# Patient Record
Sex: Female | Born: 1960 | Race: White | Hispanic: No | Marital: Married | Smoking: Former smoker
Health system: Southern US, Community
[De-identification: ages and names within clinical notes are randomized; demographics above are authoritative.]

## PROBLEM LIST (undated history)

## (undated) DIAGNOSIS — Z8619 Personal history of other infectious and parasitic diseases: Secondary | ICD-10-CM

## (undated) DIAGNOSIS — F329 Major depressive disorder, single episode, unspecified: Secondary | ICD-10-CM

## (undated) DIAGNOSIS — F32A Depression, unspecified: Secondary | ICD-10-CM

## (undated) DIAGNOSIS — F419 Anxiety disorder, unspecified: Secondary | ICD-10-CM

## (undated) DIAGNOSIS — J449 Chronic obstructive pulmonary disease, unspecified: Secondary | ICD-10-CM

## (undated) DIAGNOSIS — M797 Fibromyalgia: Secondary | ICD-10-CM

## (undated) DIAGNOSIS — B1920 Unspecified viral hepatitis C without hepatic coma: Secondary | ICD-10-CM

## (undated) DIAGNOSIS — E785 Hyperlipidemia, unspecified: Secondary | ICD-10-CM

## (undated) DIAGNOSIS — Z8719 Personal history of other diseases of the digestive system: Secondary | ICD-10-CM

## (undated) DIAGNOSIS — I10 Essential (primary) hypertension: Secondary | ICD-10-CM

## (undated) DIAGNOSIS — J439 Emphysema, unspecified: Secondary | ICD-10-CM

## (undated) DIAGNOSIS — M810 Age-related osteoporosis without current pathological fracture: Secondary | ICD-10-CM

## (undated) DIAGNOSIS — J45909 Unspecified asthma, uncomplicated: Secondary | ICD-10-CM

## (undated) DIAGNOSIS — Z8781 Personal history of (healed) traumatic fracture: Secondary | ICD-10-CM

## (undated) HISTORY — DX: Hyperlipidemia, unspecified: E78.5

## (undated) HISTORY — DX: Personal history of (healed) traumatic fracture: Z87.81

## (undated) HISTORY — DX: Unspecified viral hepatitis C without hepatic coma: B19.20

## (undated) HISTORY — PX: MANDIBLE SURGERY: SHX707

## (undated) HISTORY — DX: Chronic obstructive pulmonary disease, unspecified: J44.9

## (undated) HISTORY — DX: Emphysema, unspecified: J43.9

## (undated) HISTORY — DX: Depression, unspecified: F32.A

## (undated) HISTORY — DX: Anxiety disorder, unspecified: F41.9

## (undated) HISTORY — PX: ABDOMINAL HYSTERECTOMY: SHX81

## (undated) HISTORY — DX: Personal history of other diseases of the digestive system: Z87.19

## (undated) HISTORY — DX: Fibromyalgia: M79.7

## (undated) HISTORY — PX: BILATERAL SALPINGOOPHORECTOMY: SHX1223

## (undated) HISTORY — DX: Major depressive disorder, single episode, unspecified: F32.9

## (undated) HISTORY — DX: Age-related osteoporosis without current pathological fracture: M81.0

## (undated) HISTORY — DX: Personal history of other infectious and parasitic diseases: Z86.19

---

## 2001-07-11 ENCOUNTER — Inpatient Hospital Stay (HOSPITAL_COMMUNITY): Admission: EM | Admit: 2001-07-11 | Discharge: 2001-07-13 | Payer: Self-pay | Admitting: Psychiatry

## 2001-10-05 ENCOUNTER — Emergency Department (HOSPITAL_COMMUNITY): Admission: EM | Admit: 2001-10-05 | Discharge: 2001-10-05 | Payer: Self-pay | Admitting: *Deleted

## 2002-06-11 ENCOUNTER — Ambulatory Visit (HOSPITAL_COMMUNITY): Admission: RE | Admit: 2002-06-11 | Discharge: 2002-06-11 | Payer: Self-pay | Admitting: General Surgery

## 2002-06-11 ENCOUNTER — Encounter: Payer: Self-pay | Admitting: General Surgery

## 2002-11-20 ENCOUNTER — Encounter: Payer: Self-pay | Admitting: Emergency Medicine

## 2002-11-20 ENCOUNTER — Inpatient Hospital Stay (HOSPITAL_COMMUNITY): Admission: EM | Admit: 2002-11-20 | Discharge: 2002-11-24 | Payer: Self-pay | Admitting: Emergency Medicine

## 2002-11-22 ENCOUNTER — Encounter: Payer: Self-pay | Admitting: Family Medicine

## 2003-01-16 ENCOUNTER — Emergency Department (HOSPITAL_COMMUNITY): Admission: EM | Admit: 2003-01-16 | Discharge: 2003-01-16 | Payer: Self-pay | Admitting: Emergency Medicine

## 2003-02-02 ENCOUNTER — Inpatient Hospital Stay (HOSPITAL_COMMUNITY): Admission: AD | Admit: 2003-02-02 | Discharge: 2003-02-04 | Payer: Self-pay | Admitting: General Surgery

## 2006-09-05 ENCOUNTER — Emergency Department (HOSPITAL_COMMUNITY): Admission: EM | Admit: 2006-09-05 | Discharge: 2006-09-05 | Payer: Self-pay | Admitting: Emergency Medicine

## 2006-10-23 ENCOUNTER — Ambulatory Visit: Payer: Self-pay | Admitting: Internal Medicine

## 2006-11-03 ENCOUNTER — Observation Stay (HOSPITAL_COMMUNITY): Admission: EM | Admit: 2006-11-03 | Discharge: 2006-11-03 | Payer: Self-pay | Admitting: Emergency Medicine

## 2006-11-03 ENCOUNTER — Ambulatory Visit: Payer: Self-pay | Admitting: Cardiology

## 2006-12-20 ENCOUNTER — Emergency Department (HOSPITAL_COMMUNITY): Admission: EM | Admit: 2006-12-20 | Discharge: 2006-12-20 | Payer: Self-pay | Admitting: Emergency Medicine

## 2007-04-11 ENCOUNTER — Emergency Department (HOSPITAL_COMMUNITY): Admission: EM | Admit: 2007-04-11 | Discharge: 2007-04-11 | Payer: Self-pay | Admitting: Emergency Medicine

## 2009-03-15 ENCOUNTER — Emergency Department (HOSPITAL_COMMUNITY): Admission: EM | Admit: 2009-03-15 | Discharge: 2009-03-15 | Payer: Self-pay | Admitting: Emergency Medicine

## 2009-04-27 ENCOUNTER — Ambulatory Visit (HOSPITAL_COMMUNITY): Admission: RE | Admit: 2009-04-27 | Discharge: 2009-04-27 | Payer: Self-pay

## 2009-05-29 ENCOUNTER — Emergency Department (HOSPITAL_COMMUNITY): Admission: EM | Admit: 2009-05-29 | Discharge: 2009-05-29 | Payer: Self-pay | Admitting: Emergency Medicine

## 2010-03-04 ENCOUNTER — Encounter: Payer: Self-pay | Admitting: Neurology

## 2010-03-04 ENCOUNTER — Encounter: Payer: Self-pay | Admitting: Family Medicine

## 2010-03-05 ENCOUNTER — Encounter: Payer: Self-pay | Admitting: Cardiology

## 2010-06-26 NOTE — H&P (Signed)
[name], [name] NO.:  [id number]   MEDICAL RECORD NO.:  [id number]          PATIENT TYPE:  INP   LOCATION:  A218                          FACILITY:  [facility]   PHYSICIAN:  Skeet Latch, DO    DATE OF BIRTH:  Nov 02, 1960   DATE OF ADMISSION:  11/03/2006  DATE OF DISCHARGE:  09/22/2008LH                              HISTORY & PHYSICAL   CHIEF COMPLAINTS:  Chest pain.   PRIMARY CARE PHYSICIAN:  Dr. Gerilyn Pilgrim.   HISTORY OF PRESENT ILLNESS:  This is a 50 year old Caucasian female who  presented this morning with a 1-hour history of chest discomfort.  The  patient states that she started to feel a chest pain/pressure with  radiation to bilateral arms, that lasted for approximately 1 hour.  The  patient states that she has had issues with her stomach in the past and  was scheduled for a EGD on November 11, 2006 with Dr. Jena Gauss.  The  patient states he does not take any anti-ulcer or anti-gas medication at  this time and felt that it could be related to this.  The patient states  that she thought symptoms with subside but decided to come to the  emergency room for evaluation.  The patient denied any nausea/ vomiting,  lightheadedness, dizziness or any other associated symptoms with this  pain.  The patient states that she took approximately four aspirin in  the emergency room.  The pain disappeared and has not returned.   PAST MEDICAL HISTORY:  Includes hypertension, anxiety, depression, panic  attacks, seizure disorder and chronic pain.   SURGICAL HISTORY:  Hysterectomy, tubal ligation.   SOCIAL HISTORY:  Denies any alcohol, illicit drug use, is a one-pack per  day smoker for 20 years.   ALLERGIES:  PENICILLIN.   HOME MEDICATIONS:  1. Soma 350 mg three times a day.  2. Xanax 1 mg four times a day.  3. Restoril 30 mg at bedtime.  4. Phenobarbital, unknown dose at bedtime.  5. Keppra, unknown dose as needed.  6. Premarin 0.625 mg once a day.  7. MS Contin  30 mg every 12 hours.   REVIEW OF SYSTEMS:  CONSTITUTIONAL:  No fever, chills, appetite, weight  changes.  HEENT:  No eye pain, ear pain, blurred vision, visual changes,  hoarseness, sore throat.  CARDIOVASCULAR:  Positive for some mid-  epigastric, chest pain, no palpitations.  RESPIRATORY: No cough,  dyspnea, wheezing.  GI:  Some epigastric tenderness.  No nausea,  vomiting, diarrhea.  MUSCULOSKELETAL:  Positive for some chronic  arthralgias.  SKIN:  No rashes or pruritus.  NEUROLOGIC:  No headache.  The patient mental status changes.  PSYCHIATRIC:  Positive for anxiety,  depression.  All other systems are negative.   PHYSICAL EXAM:  GENERAL:  She is well-developed, well-nourished, well-  hydrated, no acute distress.  VITAL SIGNS:  Temperature 97.8, pulse 64, respirations 18, blood  pressure 104/65, satting 97% on room air.  HEENT:  Atraumatic, normocephalic.  PERRL.  EOMI.  NECK:  Soft, supple, nontender, nondistended.  No JVD.  No thyromegaly.  CARDIOVASCULAR:  Regular  rate and rhythm.  No murmurs, rubs, gallops.  RESPIRATORY:  Lungs clear bilaterally.  No rales, rhonchi or wheezing.  ABDOMEN:  Soft, nontender, nondistended.  No masses appreciated.  Positive bowel sounds.  EXTREMITIES:  No clubbing, cyanosis or edema.  NEUROLOGIC:  Cranial nerves II-XII grossly intact.  SKIN:  No rashes.   RADIOLOGIC:  Chest x-ray showed:  1. Borderline airway thickening that is seen on the right side, query      bronchitis.  No airspace opacities identified.   LABS:  First troponin less than 0.05, second troponin 0.03, CK-MB 1.1.  CBC:  White count 12.5, hemoglobin 13.6, hematocrit 39.5, platelets 358,  sodium 140, potassium 4.5, chloride 104, CO2 30, glucose 102, BUN 13,  creatinine 0.58, PTT 29, PT 12.9, INR 0.9, D-dimer 0.22.   ASSESSMENT AND PLAN:  Atypical chest pain.  The patient was placed on  observation on a telemetry unit.  The patient had a CBC, BMP, PT/PTT,  fasting lipid and  serial cardiac enzymes written for.  EKG was written  for that the next day.  The patient's vitals were written for q.4 h.  O2s: Keep her sats above 92%.  The patient was placed on bedrest with  normal saline lock.  The patient was placed on aspirin, as well as  nitroglycerin sublingual q.5 minutes p.r.n. for any chest pain.  The  patient was placed on DVT, as well as GI prophylaxis.  The patient has  pain medicine and IV antiemetics written.  Cardiology was consulted  also.  A 2-D echocardiogram was also ordered.      Skeet Latch, DO  Electronically Signed     SM/MEDQ  D:  11/03/2006  T:  11/03/2006  Job:  [id number]

## 2010-06-26 NOTE — Discharge Summary (Signed)
[name], [name] NO.:  [id number]   MEDICAL RECORD NO.:  [id number]          PATIENT TYPE:  INP   LOCATION:  A218                          FACILITY:  [facility]   PHYSICIAN:  Skeet Latch, DO    DATE OF BIRTH:  01-14-1961   DATE OF ADMISSION:  11/03/2006  DATE OF DISCHARGE:  09/22/2008LH                               DISCHARGE SUMMARY   DISCHARGE DIAGNOSES:  1. Atypical chest pain, probable GI related.  2. Anxiety.  3. Depression.  4. Tobacco abuse.  5. Hypertension.  6. Seizure disorder.   BRIEF HOSPITAL COURSE:  This is a 50 year old Caucasian female who  presented with a one hour history of chest discomfort with radiation to  her bilateral upper extremities.  The patient states that the above  symptoms last for approximately one hour without relief and decided to  come to the emergency room for evaluation.  The patient received four  aspirin in the emergency room and her pain subsided.  The patient has no  known cardiac history and her only medical history is for hypertension,  anxiety, depression, panic attacks, seizure disorder and chronic pain.  Cardiology has seen the patient and felt that this may be related to  some type of reflux disease and the patient is clear per cardiology to  have an outpatient stress test and follow-up with gastroenterology for  scheduled EGD on November 11, 2006.  The patient is stable.  The  patient is not complaining of any chest discomfort, nausea, vomiting or  any type symptoms at this time and is anticipating  discharge.   DISCHARGE VITAL SIGNS:  Temperature 97.8, pulse 64, respirations 18,  blood pressure 104/65, sating 97% on room air.   LABORATORIES ON DISCHARGE:  The last troponin was 0.03, CK-MB 0.8, total  creatinine kinase 25, sodium 140, potassium 4.5, chloride 104, CO2 is  30, glucose 102, BUN 13, creatinine 0.58, PTT 29.  PT 12.9, INR 0.9,  white count 12.5, hemoglobin 13.6, hematocrit 39.5, platelets 358.   The  patient had D-dimer at 0.22.   MEDICATIONS ON DISCHARGE:  1. Enteric coated aspirin 81 mg daily.  2. Nitroglycerin sublingual 0.4 mg q. 5 minutes p.r.n. chest pain.  3. MS Contin 30 mg twice a day p.r.n.  4. Premarin 0.6 ounces for 0.625 mg once a day.  5. Keppra as needed.  6. Phenobarbital, previous dose at bedtime.  7. Restoril  30 mg at bedtime.  8. Xanax 1 mg four times a day p.r.n.  9. Soma 350 mg three times a day.   CONDITION ON DISCHARGE:  Stable.   DISPOSITION:  The patient is discharged to home.   DISCHARGE INSTRUCTIONS:  The patient is to maintain a low sodium, heart  healthy diet, increase activity slowly.  The patient is to follow up  with Dr. Gerilyn Pilgrim in 1-2 weeks.  The patient is also scheduled to have a  stress Myoview tomorrow morning as an outpatient, and the patient states  that she has a scheduled appointment with Gastroenterology for EGD on  November 11, 2006.  I stressed to the  patient the need to keep these  appointments and to return to the emergency room if she has any  complications.  The patient understands these instructions.      Skeet Latch, DO  Electronically Signed     SM/MEDQ  D:  11/03/2006  T:  11/04/2006  Job:  [id number]   cc:   Darleen Crocker A. Gerilyn Pilgrim, M.D.  Fax: [phone]   Gerrit Friends. Dietrich Pates, MD, [facility]  [address]  [place], [place] [address]
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
## 2010-11-20 LAB — DIFFERENTIAL
Basophils Absolute: 0
Basophils Relative: 0
Eosinophils Absolute: 0.1
Eosinophils Relative: 1
Lymphocytes Relative: 16
Lymphs Abs: 2.4
Monocytes Absolute: 0.3
Monocytes Relative: 2 — ABNORMAL LOW
Neutro Abs: 12.2 — ABNORMAL HIGH
Neutrophils Relative %: 81 — ABNORMAL HIGH

## 2010-11-20 LAB — URINALYSIS, ROUTINE W REFLEX MICROSCOPIC
Bilirubin Urine: NEGATIVE
Glucose, UA: NEGATIVE
Hgb urine dipstick: NEGATIVE
Ketones, ur: 15 — AB
Leukocytes, UA: NEGATIVE
Nitrite: POSITIVE — AB
Specific Gravity, Urine: >1.03 — ABNORMAL HIGH
Urobilinogen, UA: 0.2
pH: 6

## 2010-11-20 LAB — CBC
HCT: 41.8
Hemoglobin: 14.3
MCHC: 34.2
MCV: 96.3
Platelets: 733 — ABNORMAL HIGH
RBC: 4.34
RDW: 13.6
WBC: 15.1 — ABNORMAL HIGH

## 2010-11-20 LAB — URINE MICROSCOPIC-ADD ON

## 2010-11-20 LAB — BASIC METABOLIC PANEL
BUN: 6
CO2: 28
Calcium: 9
Chloride: 105
Creatinine, Ser: 0.64
GFR calc Af Amer: >60
GFR calc non Af Amer: >60
Glucose, Bld: 100 — ABNORMAL HIGH
Potassium: 4.1
Sodium: 140

## 2010-11-22 LAB — BASIC METABOLIC PANEL
BUN: 11
BUN: 13
CO2: 30
CO2: 32
Calcium: 9.1
Calcium: 9.7
Chloride: 104
Chloride: 104
Creatinine, Ser: 0.58
Creatinine, Ser: 0.58
GFR calc Af Amer: >60
GFR calc Af Amer: >60
GFR calc non Af Amer: >60
GFR calc non Af Amer: >60
Glucose, Bld: 102 — ABNORMAL HIGH
Glucose, Bld: 115 — ABNORMAL HIGH
Potassium: 4.4
Potassium: 4.5
Sodium: 140
Sodium: 143

## 2010-11-22 LAB — POCT CARDIAC MARKERS
CKMB, poc: 1.1
Myoglobin, poc: 21.7
Operator id: 166561
Troponin i, poc: <0.05

## 2010-11-22 LAB — CBC
HCT: 39.5
HCT: 41.6
Hemoglobin: 13.6
Hemoglobin: 14.2
MCHC: 34.1
MCHC: 34.4
MCV: 97.5
MCV: 97.9
Platelets: 358
Platelets: 406 — ABNORMAL HIGH
RBC: 4.05
RBC: 4.25
RDW: 12.8
RDW: 12.9
WBC: 12.5 — ABNORMAL HIGH
WBC: 14.9 — ABNORMAL HIGH

## 2010-11-22 LAB — D-DIMER, QUANTITATIVE: D-Dimer, Quant: 0.22

## 2010-11-22 LAB — DIFFERENTIAL
Basophils Absolute: 0
Basophils Absolute: 0
Basophils Relative: 0
Basophils Relative: 0
Eosinophils Absolute: 0.7
Eosinophils Absolute: 0.7
Eosinophils Relative: 5
Eosinophils Relative: 6 — ABNORMAL HIGH
Lymphocytes Relative: 31
Lymphocytes Relative: 37
Lymphs Abs: 4.6 — ABNORMAL HIGH
Lymphs Abs: 4.6 — ABNORMAL HIGH
Monocytes Absolute: 0.7
Monocytes Absolute: 0.8 — ABNORMAL HIGH
Monocytes Relative: 4
Monocytes Relative: 6
Neutro Abs: 6.4
Neutro Abs: 8.8 — ABNORMAL HIGH
Neutrophils Relative %: 51
Neutrophils Relative %: 60

## 2010-11-22 LAB — LIPID PANEL: HDL: 51

## 2010-11-22 LAB — CARDIAC PANEL(CRET KIN+CKTOT+MB+TROPI)
Relative Index: INVALID
Total CK: 25
Troponin I: 0.03

## 2010-11-22 LAB — PROTIME-INR
INR: 0.9
Prothrombin Time: 12.9

## 2010-11-22 LAB — APTT: aPTT: 29

## 2010-11-26 LAB — LIPASE, BLOOD: Lipase: 20

## 2010-11-26 LAB — URINALYSIS, ROUTINE W REFLEX MICROSCOPIC
Bilirubin Urine: NEGATIVE
Ketones, ur: NEGATIVE
Leukocytes, UA: NEGATIVE
Nitrite: POSITIVE — AB
Protein, ur: NEGATIVE
Urobilinogen, UA: 0.2

## 2010-11-26 LAB — COMPREHENSIVE METABOLIC PANEL
AST: 40 — ABNORMAL HIGH
Albumin: 3.7
Calcium: 9.1
Chloride: 101
Creatinine, Ser: 0.81
GFR calc Af Amer: >60
Total Bilirubin: 0.5

## 2010-11-26 LAB — DIFFERENTIAL
Basophils Absolute: 0.1
Basophils Relative: 1
Eosinophils Absolute: 0.6
Monocytes Absolute: 0.5

## 2010-11-26 LAB — CBC
MCV: 97
Platelets: 195
WBC: 12.9 — ABNORMAL HIGH

## 2010-11-26 LAB — AMYLASE: Amylase: 54

## 2010-11-26 LAB — URINE MICROSCOPIC-ADD ON

## 2013-10-08 ENCOUNTER — Other Ambulatory Visit: Payer: Self-pay | Admitting: Obstetrics and Gynecology

## 2013-10-08 ENCOUNTER — Telehealth: Payer: Self-pay | Admitting: *Deleted

## 2013-10-08 NOTE — Telephone Encounter (Signed)
Pt states that she couldn't be seen today due to her insurance. Pt wants to know if [facility] can give her Rx for hormones until she is seen in September. Pt aware that she would have to be seen before she could get any medication. Pt states that she has not had her hormone medication in about 3 years. Pt verbalized understanding that she would have to be seen first to get Rx.

## 2013-11-08 ENCOUNTER — Other Ambulatory Visit: Payer: Self-pay | Admitting: Obstetrics and Gynecology

## 2014-09-07 ENCOUNTER — Ambulatory Visit (INDEPENDENT_AMBULATORY_CARE_PROVIDER_SITE_OTHER): Payer: Self-pay | Admitting: Obstetrics and Gynecology

## 2014-09-07 ENCOUNTER — Encounter: Payer: Self-pay | Admitting: Obstetrics and Gynecology

## 2014-09-07 VITALS — HR 76 | Ht 63.0 in | Wt 176.0 lb

## 2014-09-07 DIAGNOSIS — Z0000 Encounter for general adult medical examination without abnormal findings: Secondary | ICD-10-CM

## 2014-09-07 DIAGNOSIS — B009 Herpesviral infection, unspecified: Secondary | ICD-10-CM

## 2014-09-07 MED ORDER — ACYCLOVIR 400 MG PO TABS
400.0000 mg | ORAL_TABLET | Freq: Two times a day (BID) | ORAL | Status: DC
Start: 2014-09-07 — End: 2014-10-04

## 2014-09-07 MED ORDER — ESTRADIOL 1 MG PO TABS: 1.0000 mg | ORAL_TABLET | Freq: Every day | ORAL | Status: DC

## 2014-09-07 NOTE — Progress Notes (Signed)
Patient ID: [name], female   DOB: 1960-11-29, 54 y.o.   MRN: [id number] Pt here today for annual exam. Pt states that she needs some refills on medications.

## 2014-09-07 NOTE — Progress Notes (Signed)
Patient ID: ELWYN LOWDEN, female   DOB: 22-Jan-1961, 54 y.o.   MRN: [id number] Assessment:  Annual Gyn Exam Postmenopausal Hot Flashes Recurrent HSV-2 Outbreak Plan:  1. return annually or prn for medication re-evaluation 2.    Annual mammogram advised as soon as Medicaid issues are resolved 3.   Rx estradiol 1 mg qd 4.   Rx acyclovir   Subjective:  [name] is a 54 y.o. female with a history of complete hysterectomy done 26 years ago, who presents for annual exam. No LMP recorded. Patient has had a hysterectomy.  The patient has complaints today of chronic, irritating hot flashes that have been ongoing for several months that have been exacerbated recently by hot weather. Patient states she had tried OTC Estroven with mild relief. She states she wakes up 3-4 times per night due to her symptoms. Patient denies a history of any abnormal pap smears.  Patient also complains of HSV-2 outbreaks. She had her first outbreak 15 years ago, and notes no outbreaks since. In the past several months, however, she has had 1 outbreak per month.   Patient states she had a colonoscopy last year. Patient recently quit smoking. She states she has started using Garcinia supplements for weight loss.    Patient states she has been having issues sorting out Medicaid.     The following portions of the patient's history were reviewed and updated as appropriate: allergies, current medications, past family history, past medical history, past social history, past surgical history and problem list. Past Medical History  Diagnosis Date  . COPD (chronic obstructive pulmonary disease)   . Anxiety     Past Surgical History  Procedure Laterality Date  . Abdominal hysterectomy       Current outpatient prescriptions:  .  buprenorphine-naloxone (SUBOXONE) 2-0.5 MG SUBL SL tablet, Place 1 tablet under the tongue daily., Disp: , Rfl:   Review of Systems Constitutional: negative Gastrointestinal:  negative Genitourinary: negative   Objective:  Pulse 76  Ht 5\' 3"  (1.6 m)  Wt 176 lb (79.833 kg)  BMI 31.18 kg/m2   BMI: Body mass index is 31.18 kg/(m^2).  General Appearance: Alert, appropriate appearance for age. No acute distress HEENT: Grossly normal Neck / Thyroid:  Cardiovascular: RRR; normal S1, S2, no murmur Lungs: CTA bilaterally Back: No CVAT Breast Exam: No dimpling, nipple retraction or discharge. No masses or nodes., Normal to inspection, Normal breast tissue bilaterally and No masses or nodes.No dimpling, nipple retraction or discharge. Gastrointestinal: Soft, non-tender, no masses or organomegaly Pelvic Exam: External genitalia: normal general appearance Urinary system: urethral meatus normal Vaginal: atrophic vaginal tissues, good support Cervix: removed surgically Adnexa: normal bimanual exam Uterus: removed surgically Rectal: good sphincter tone, no masses, guaiac negative Rectovaginal: not indicated Lymphatic Exam: Non-palpable nodes in neck, clavicular, axillary, or inguinal regions  Skin: no rash or abnormalities Neurologic: Normal gait and speech, no tremor  Psychiatric: Alert and oriented, appropriate affect.  Urinalysis:Not done  Mallory Shirk. MD Pgr [phone] 2:35 PM   This chart was SCRIBED for Mallory Shirk, MD by Stephania Fragmin, ED Scribe. This patient was seen in room 1, and the patient's care was started at 2:35 PM.  I personally performed the services described in this documentation, which was SCRIBED in my presence. The recorded information has been reviewed and considered accurate. It has been edited as necessary during review. Jonnie Kind, MD

## 2014-10-04 ENCOUNTER — Other Ambulatory Visit: Payer: Self-pay | Admitting: Obstetrics and Gynecology

## 2015-04-14 ENCOUNTER — Other Ambulatory Visit: Payer: Self-pay | Admitting: Obstetrics and Gynecology

## 2015-04-17 ENCOUNTER — Other Ambulatory Visit: Payer: Self-pay | Admitting: *Deleted

## 2015-05-17 ENCOUNTER — Encounter: Payer: Self-pay | Admitting: Obstetrics and Gynecology

## 2015-05-17 ENCOUNTER — Ambulatory Visit (INDEPENDENT_AMBULATORY_CARE_PROVIDER_SITE_OTHER): Payer: Medicaid Other | Admitting: Obstetrics and Gynecology

## 2015-05-17 VITALS — BP 120/70 | Ht 63.5 in | Wt 170.0 lb

## 2015-05-17 DIAGNOSIS — E46 Unspecified protein-calorie malnutrition: Secondary | ICD-10-CM | POA: Diagnosis not present

## 2015-05-17 DIAGNOSIS — Z713 Dietary counseling and surveillance: Secondary | ICD-10-CM

## 2015-05-17 DIAGNOSIS — Z6829 Body mass index (BMI) 29.0-29.9, adult: Secondary | ICD-10-CM

## 2015-05-17 DIAGNOSIS — E669 Obesity, unspecified: Secondary | ICD-10-CM

## 2015-05-17 NOTE — Progress Notes (Signed)
[facility] Visit  Patient name: [name] MRN [id number]  Date of birth: 16-Oct-1960  CC & HPI:  [name] is a 55 y.o. female presenting requesting assistance with weight loss. Patient states she has lost 170 lbs, down from 211 lbs, since starting her weight loss efforts. She attributes her weight loss to using an over-the-counter supplement called Garcinia, but she notes she also walks around the house and exercises on a bike. She states she has been taking in 500-700 calories a day for the past year: Whey protein shake (200 calories) for breakfast, skipping lunch, and a salad for dinner that is made of vegetables and fat-free dressing (70 calories per serving). She states she uses sugar substitute in her drinks (Equal in her coffee and diet soda). However, she states over the past 3 months, her weight loss has reached a plateau despite maintaining the exercise, diet, and supplement regimen. Patient also states she has noticed her fingernails "peeling away from [her] skin." She states she had started taking iron supplements, which appeared to alleviate her symptoms for a short period of time, but it soon returned. Patient states she occasionally has leg cramps, for which she has been taking Flexeril.     ROS:  ROS  History of osteoarthritis History of obesity Energy and activity levels discussed  Pertinent History Reviewed:   Reviewed: Significant for abdominal hysterectomy Medical         Past Medical History  Diagnosis Date  . COPD (chronic obstructive pulmonary disease) ([place])   . Anxiety   . Fibromyalgia   . Osteoporosis                               Surgical Hx:    Past Surgical History  Procedure Laterality Date  . Abdominal hysterectomy     Medications: Reviewed & Updated - see associated section                       Current outpatient prescriptions:  .  acyclovir (ZOVIRAX) 400 MG tablet, TAKE (1) TABLET TWICE DAILY., Disp: 60 tablet, Rfl: 11 .   ALBUTEROL SULFATE HFA IN, Inhale into the lungs., Disp: , Rfl:  .  buprenorphine-naloxone (SUBOXONE) 2-0.5 MG SUBL SL tablet, Place 1 tablet under the tongue daily., Disp: , Rfl:  .  cyclobenzaprine (FLEXERIL) 10 MG tablet, Take 10 mg by mouth 3 (three) times daily as needed for muscle spasms., Disp: , Rfl:  .  estradiol (ESTRACE) 1 MG tablet, Take 1 tablet (1 mg total) by mouth daily., Disp: 30 tablet, Rfl: 11   Social History: Reviewed -  reports that she has quit smoking. She has never used smokeless tobacco.  Objective Findings:  Vitals: Blood pressure 120/70, height 5' 3.5" (1.613 m), weight 170 lb (77.111 kg).  Physical Examination:   Discussed with pt risks and benefits of weight, exercise programs, and general wellness issues. At end of discussion, pt had opportunity to ask questions and has no further questions at this time.   Greater than 50% was spent in counseling and coordination of care with the patient. Total time greater than: 25 minutes minutes    Assessment & Plan:   A:  1. Desire for weight loss. Hx 40 lb wt loss with exercise and garinia..  P:   1. [employer] arthritis/water classes. 2. Baseline labs. 3. Follow-up conversation prn.   By signing my  name below, I, Stephania Fragmin, attest that this documentation has been prepared under the direction and in the presence of Jonnie Kind, MD. Electronically Signed: Stephania Fragmin, ED Scribe. 05/17/2015. 2:27 PM.  I personally performed the services described in this documentation, which was SCRIBED in my presence. The recorded information has been reviewed and considered accurate. It has been edited as necessary during review. Jonnie Kind, MD

## 2015-05-18 LAB — COMPREHENSIVE METABOLIC PANEL
A/G RATIO: 1.7 (ref 1.2–2.2)
ALBUMIN: 4.2 g/dL (ref 3.5–5.5)
ALT: 19 IU/L (ref 0–32)
AST: 19 IU/L (ref 0–40)
Alkaline Phosphatase: 65 IU/L (ref 39–117)
BILIRUBIN TOTAL: 0.3 mg/dL (ref 0.0–1.2)
BUN/Creatinine Ratio: 21 (ref 9–23)
BUN: 12 mg/dL (ref 6–24)
CHLORIDE: 101 mmol/L (ref 96–106)
CO2: 25 mmol/L (ref 18–29)
Calcium: 9.2 mg/dL (ref 8.7–10.2)
Creatinine, Ser: 0.56 mg/dL — ABNORMAL LOW (ref 0.57–1.00)
GFR, EST AFRICAN AMERICAN: 121 mL/min/{1.73_m2} (ref >59)
GFR, EST NON AFRICAN AMERICAN: 105 mL/min/{1.73_m2} (ref >59)
GLOBULIN, TOTAL: 2.5 g/dL (ref 1.5–4.5)
GLUCOSE: 72 mg/dL (ref 65–99)
POTASSIUM: 5.6 mmol/L — AB (ref 3.5–5.2)
SODIUM: 141 mmol/L (ref 134–144)
Total Protein: 6.7 g/dL (ref 6.0–8.5)

## 2015-05-18 LAB — CBC
HEMATOCRIT: 39.2 % (ref 34.0–46.6)
Hemoglobin: 13.1 g/dL (ref 11.1–15.9)
MCH: 32.1 pg (ref 26.6–33.0)
MCHC: 33.4 g/dL (ref 31.5–35.7)
MCV: 96 fL (ref 79–97)
PLATELETS: 316 10*3/uL (ref 150–379)
RBC: 4.08 x10E6/uL (ref 3.77–5.28)
RDW: 12.4 % (ref 12.3–15.4)
WBC: 8.4 10*3/uL (ref 3.4–10.8)

## 2015-05-18 LAB — LIPID PANEL
CHOL/HDL RATIO: 2.6 ratio (ref 0.0–4.4)
Cholesterol, Total: 190 mg/dL (ref 100–199)
HDL: 74 mg/dL (ref >39)
LDL Calculated: 98 mg/dL (ref 0–99)
Triglycerides: 90 mg/dL (ref 0–149)
VLDL Cholesterol Cal: 18 mg/dL (ref 5–40)

## 2015-05-18 LAB — TSH: TSH: 1.83 u[IU]/mL (ref 0.450–4.500)

## 2015-06-28 ENCOUNTER — Encounter: Payer: Self-pay | Admitting: Obstetrics and Gynecology

## 2015-06-28 ENCOUNTER — Ambulatory Visit: Payer: Medicaid Other | Admitting: Obstetrics and Gynecology

## 2015-09-02 ENCOUNTER — Other Ambulatory Visit: Payer: Self-pay | Admitting: Obstetrics and Gynecology

## 2015-09-04 ENCOUNTER — Other Ambulatory Visit: Payer: Self-pay | Admitting: *Deleted

## 2015-09-04 ENCOUNTER — Telehealth: Payer: Self-pay | Admitting: *Deleted

## 2015-09-05 ENCOUNTER — Other Ambulatory Visit: Payer: Self-pay | Admitting: Obstetrics and Gynecology

## 2015-09-06 MED ORDER — ESTRADIOL 1 MG PO TABS: 1.0000 mg | ORAL_TABLET | Freq: Every day | ORAL | 1 refills | Status: DC

## 2015-09-11 ENCOUNTER — Encounter: Payer: Self-pay | Admitting: Obstetrics and Gynecology

## 2015-09-11 ENCOUNTER — Ambulatory Visit (INDEPENDENT_AMBULATORY_CARE_PROVIDER_SITE_OTHER): Payer: Medicaid Other | Admitting: Obstetrics and Gynecology

## 2015-09-11 VITALS — BP 130/70 | Ht 63.5 in | Wt 162.5 lb

## 2015-09-11 DIAGNOSIS — Z0000 Encounter for general adult medical examination without abnormal findings: Secondary | ICD-10-CM | POA: Diagnosis not present

## 2015-09-11 DIAGNOSIS — N951 Menopausal and female climacteric states: Secondary | ICD-10-CM | POA: Insufficient documentation

## 2015-09-11 DIAGNOSIS — Z1211 Encounter for screening for malignant neoplasm of colon: Secondary | ICD-10-CM

## 2015-09-11 DIAGNOSIS — B009 Herpesviral infection, unspecified: Secondary | ICD-10-CM

## 2015-09-11 DIAGNOSIS — Z01419 Encounter for gynecological examination (general) (routine) without abnormal findings: Secondary | ICD-10-CM

## 2015-09-11 MED ORDER — ACYCLOVIR 400 MG PO TABS: ORAL_TABLET | ORAL | 11 refills | Status: DC

## 2015-09-11 MED ORDER — ESTRADIOL 1 MG PO TABS: 1.0000 mg | ORAL_TABLET | Freq: Every day | ORAL | 11 refills | Status: DC

## 2015-09-11 NOTE — Progress Notes (Signed)
Patient ID: [name], female   DOB: 03/31/1960, 55 y.o.   MRN: [id number]   Assessment:  Annual Gyn Exam Desire for continued weight loss Generalized fatigue d/t decrease in physical activity    Plan:  1. pap smear not done s/p abdominal hysterectomy  2. return annually or prn 3    Annual mammogram and regular self breast exams advised 4. Continue with weight loss strategies 5. Continue Estradiol  6. Add physical activity to regain muscle mass and consider arthritis classes at [facility]  Subjective:  [name] is a 55 y.o. female No obstetric history on file. who presents for annual exam. No LMP recorded. Patient has had a hysterectomy.   Pt states she has been utilizing the weight loss methods discussed at her last visit on 05/17/15 and has lost 8 lbs as of today. She does complain of mild generalized fatigue for a month. She denies bowel or bladder incontinence. Pt is not currently sexually active.   The following portions of the patient's history were reviewed and updated as appropriate: allergies, current medications, past family history, past medical history, past social history, past surgical history and problem list. Past Medical History:  Diagnosis Date  . Anxiety   . COPD (chronic obstructive pulmonary disease) ([place])   . Fibromyalgia   . Osteoporosis     Past Surgical History:  Procedure Laterality Date  . ABDOMINAL HYSTERECTOMY       Current Outpatient Prescriptions:  .  acyclovir (ZOVIRAX) 400 MG tablet, TAKE (1) TABLET TWICE DAILY., Disp: 60 tablet, Rfl: 11 .  ALBUTEROL SULFATE HFA IN, Inhale into the lungs., Disp: , Rfl:  .  buprenorphine-naloxone (SUBOXONE) 2-0.5 MG SUBL SL tablet, Place 1 tablet under the tongue daily., Disp: , Rfl:  .  cyclobenzaprine (FLEXERIL) 10 MG tablet, Take 10 mg by mouth 3 (three) times daily as needed for muscle spasms., Disp: , Rfl:  .  estradiol (ESTRACE) 1 MG tablet, Take 1 tablet (1 mg total) by mouth daily., Disp: 30 tablet,  Rfl: 1  Review of Systems Constitutional: generalized fatigue Gastrointestinal: negative Genitourinary: negative     Objective:  BP 130/70   Ht 5' 3.5" (1.613 m)   Wt 162 lb 8 oz (73.7 kg)   BMI 28.33 kg/m    BMI: Body mass index is 28.33 kg/m.  General Appearance: Alert, appropriate appearance for age. No acute distress HEENT: Grossly normal Neck / Thyroid:  Cardiovascular: RRR; normal S1, S2, no murmur Lungs: CTA bilaterally Back: No CVAT Breast Exam: No masses or nodes.No dimpling, nipple retraction or discharge. Gastrointestinal: Soft, non-tender, no masses or organomegaly Pelvic Exam:  External genitalia: normal general appearance Vaginal: normal mucosa without prolapse or lesions, mild tissue thinning, scant normal-appearing secretions Cervix: absent and removed surgically Adnexa: removed surgically and absent, bilateral Uterus: absent and removed surgically Rectovaginal: normal rectal, no masses and guaiac negative stool obtained Lymphatic Exam: Non-palpable nodes in neck, clavicular, axillary, or inguinal regions  Skin: no rash or abnormalities Neurologic: Normal gait and speech, no tremor  Psychiatric: Alert and oriented, appropriate affect.  Urinalysis:Not done  Guaiac negative   [name]. MD Pgr [phone] 3:55 PM    By signing my name below, I, [name], attest that this documentation has been prepared under the direction and in the presence of Michelle Kind, MD. Electronically Signed: Hansel Bentley, ED Scribe. 09/11/15. 4:04 PM.  I personally performed the services described in this documentation, which was SCRIBED in my presence. The recorded information  has been reviewed and considered accurate. It has been edited as necessary during review. Michelle Kind, MD

## 2015-11-15 NOTE — Telephone Encounter (Signed)
Message sent to Dr. Glo Herring.

## 2016-05-02 ENCOUNTER — Other Ambulatory Visit: Payer: Self-pay | Admitting: Obstetrics and Gynecology

## 2016-05-02 NOTE — Telephone Encounter (Signed)
Acyclovir refilled.

## 2016-09-18 ENCOUNTER — Other Ambulatory Visit: Payer: Medicaid Other | Admitting: Obstetrics and Gynecology

## 2016-09-30 ENCOUNTER — Ambulatory Visit (INDEPENDENT_AMBULATORY_CARE_PROVIDER_SITE_OTHER): Payer: Medicaid Other | Admitting: Obstetrics and Gynecology

## 2016-09-30 ENCOUNTER — Encounter: Payer: Self-pay | Admitting: Obstetrics and Gynecology

## 2016-09-30 VITALS — BP 100/60 | HR 90 | Ht 62.0 in | Wt 177.0 lb

## 2016-09-30 DIAGNOSIS — N951 Menopausal and female climacteric states: Secondary | ICD-10-CM | POA: Diagnosis not present

## 2016-09-30 DIAGNOSIS — B3731 Acute candidiasis of vulva and vagina: Secondary | ICD-10-CM | POA: Insufficient documentation

## 2016-09-30 DIAGNOSIS — B373 Candidiasis of vulva and vagina: Secondary | ICD-10-CM

## 2016-09-30 MED ORDER — ACYCLOVIR 400 MG PO TABS: ORAL_TABLET | ORAL | 12 refills | Status: DC

## 2016-09-30 MED ORDER — ESTRADIOL 1 MG PO TABS: 1.0000 mg | ORAL_TABLET | Freq: Every day | ORAL | 3 refills | Status: DC

## 2016-09-30 MED ORDER — FLUCONAZOLE 150 MG PO TABS: 150.0000 mg | ORAL_TABLET | Freq: Once | ORAL | 1 refills | Status: AC

## 2016-09-30 NOTE — Progress Notes (Signed)
Assessment:  1. Annual Gyn Exam 2. Yeast infection  3 recent weight gain Plan:  1. pap smear not done, due to surgically removed cervix 2. return annually or prn 3    Annual mammogram advised 4.   Acyclovir and Estradiol prescriptions refilled 5.  Prescribe Diflucan Subjective:  [name] is a 56 y.o. female [id number] who presents for annual exam. No LMP recorded. Patient has had a hysterectomy. The patient has complaints today of hot flashes returning due to finishing her hormone pills yesterday. Pt reports slight itching of vaginal entrance, and says it might be the soap she used today. She recently finished Abx two days ago for strep throat. Pt's last mammogram was in 2011. Pt wants to lose about 60 lbs to reach her goal weight and wants to discuss weight loss options. Pt also asked about Garcinia [place] herbal supplements.  The following portions of the patient's history were reviewed and updated as appropriate: allergies, current medications, past family history, past medical history, past social history, past surgical history and problem list. Past Medical History:  Diagnosis Date  . Anxiety   . COPD (chronic obstructive pulmonary disease) ([place])   . Fibromyalgia   . Osteoporosis     Past Surgical History:  Procedure Laterality Date  . ABDOMINAL HYSTERECTOMY       Current Outpatient Prescriptions:  .  acyclovir (ZOVIRAX) 400 MG tablet, TAKE (1) TABLET TWICE DAILY., Disp: 60 tablet, Rfl: 0 .  ALBUTEROL SULFATE HFA IN, Inhale into the lungs as needed. , Disp: , Rfl:  .  ALPRAZolam (XANAX) 1 MG tablet, Take 1 mg by mouth as needed for anxiety., Disp: , Rfl:  .  buprenorphine-naloxone (SUBOXONE) 2-0.5 MG SUBL SL tablet, Place 0.5 tablets under the tongue daily. , Disp: , Rfl:  .  cyclobenzaprine (FLEXERIL) 10 MG tablet, Take 10 mg by mouth 3 (three) times daily as needed for muscle spasms., Disp: , Rfl:  .  mometasone-formoterol (DULERA) 200-5 MCG/ACT AERO, Inhale 2 puffs  into the lungs 2 (two) times daily., Disp: , Rfl:  .  Multiple Vitamins-Minerals (ONE-A-DAY WOMENS 50+ ADVANTAGE PO), Take by mouth daily., Disp: , Rfl:  .  estradiol (ESTRACE) 1 MG tablet, Take 1 tablet (1 mg total) by mouth daily., Disp: 30 tablet, Rfl: 11  Review of Systems Constitutional: negative Gastrointestinal: negative Genitourinary: negative  Objective:  BP 100/60 (BP Location: Left Arm, Patient Position: Sitting, Cuff Size: Normal)   Pulse 90   Ht 5\' 2"  (1.575 m)   Wt 177 lb (80.3 kg)   BMI 32.37 kg/m    BMI: Body mass index is 32.37 kg/m.  General Appearance: Alert, appropriate appearance for age. No acute distress HEENT: Grossly normal Neck / Thyroid:  Cardiovascular: RRR; normal S1, S2, no murmur Lungs: CTA bilaterally Back: No CVAT Breast Exam: No masses or nodes.No dimpling, nipple retraction or discharge. Firm tissues. Gastrointestinal: Soft, non-tender, no masses or organomegaly Pelvic Exam:  External genitalia: normal general appearance Vaginal: normal mucosa without prolapse or lesions, well supported Cervix: absent and removed surgically Adnexa: removed surgically and absent, bilateral Uterus: absent and removed surgically Rectovaginal: not indicated Lymphatic Exam: Non-palpable nodes in neck, clavicular, axillary, or inguinal regions  Skin: no rash or abnormalities Neurologic: Normal gait and speech, no tremor  Psychiatric: Alert and oriented, appropriate affect.  Urinalysis:Not done   Wet Prep: Positive for yeast  Discussion: Water aerobics to work out entire body, and resistance bands as an alternative were discussed with pt for weight  loss options.    Mallory Shirk. MD Pgr [phone] 4:43 PM     By signing my name below, I, Izna Ahmed, attest that this documentation has been prepared under the direction and in the presence of Jonnie Kind, MD. Electronically Signed: Jabier Gauss, Medical Scribe. 09/30/16. 4:43 PM.  I personally  performed the services described in this documentation, which was SCRIBED in my presence. The recorded information has been reviewed and considered accurate. It has been edited as necessary during review. Jonnie Kind, MD

## 2017-02-26 ENCOUNTER — Other Ambulatory Visit: Payer: Self-pay | Admitting: Obstetrics and Gynecology

## 2017-08-09 ENCOUNTER — Other Ambulatory Visit: Payer: Self-pay | Admitting: Obstetrics and Gynecology

## 2017-10-01 ENCOUNTER — Other Ambulatory Visit: Payer: Self-pay | Admitting: Obstetrics and Gynecology

## 2017-10-16 ENCOUNTER — Other Ambulatory Visit: Payer: Self-pay | Admitting: Obstetrics and Gynecology

## 2017-11-17 ENCOUNTER — Other Ambulatory Visit: Payer: Self-pay | Admitting: Obstetrics and Gynecology

## 2017-11-17 NOTE — Telephone Encounter (Signed)
Refill estradiol x 1 yr

## 2017-11-20 ENCOUNTER — Ambulatory Visit: Payer: Medicaid Other | Admitting: Obstetrics and Gynecology

## 2017-11-20 ENCOUNTER — Encounter: Payer: Self-pay | Admitting: Obstetrics and Gynecology

## 2017-11-20 VITALS — BP 156/94 | HR 82 | Ht 63.0 in | Wt 191.8 lb

## 2017-11-20 DIAGNOSIS — Z01419 Encounter for gynecological examination (general) (routine) without abnormal findings: Secondary | ICD-10-CM

## 2017-11-20 DIAGNOSIS — Z0000 Encounter for general adult medical examination without abnormal findings: Secondary | ICD-10-CM | POA: Diagnosis not present

## 2017-11-20 MED ORDER — PHENTERMINE HCL 37.5 MG PO CAPS: 37.5000 mg | ORAL_CAPSULE | ORAL | 2 refills | Status: DC

## 2017-11-20 MED ORDER — ACYCLOVIR 400 MG PO TABS: ORAL_TABLET | ORAL | 12 refills | Status: DC

## 2017-11-20 NOTE — Patient Instructions (Addendum)
Weight loss guidelines and tips   1. Utilize measuring cups and spoons (found at a low price at [facility]) to monitor and manage serving sizes. Use them to carefully measure out serving sizes listed on food packaging to prevent overeating and better manage caloric intake. Most of [place] tell ourselves lies about how much a serving really is. For example, a serving of meat is actually 4 oz, which is the size of a deck of cards!!!   2. Utilize smart phone apps like "My Net Diary", "My Fitness Pal", "Lose It", or "Eat Better" to keep track of calorie intake and exercise. Also consider purchasing a pedometer to keep track of daily steps. Some smart phones have built in pedometers to keep track of step counts (your goal is 10,000 steps per day). Or 30 minutes a day x 5 days per week in the pool in ACTIVE exercises.  3. Make sure you are consuming enough water daily. Drink 8 oz prior to meals, or at snack time, to cut down on overeating.   4. Consider exercise programs. [employer] offers water aerobics classes that are low-impact to minimize joint pains while still burning calories. For maximum impact at home, do small exercises while watching TV during commercial breaks.   The Truth Table:   3,500 calories = 1 pound of fat  Minus 500 calories a day x 7 days will lose you 1 pound  Exercise helps, but one mile walk = 200 calories burned   10,000 steps is your goal for the day, that's 4-5 miles!   *Please cut out and put on your refrigerator*

## 2017-11-20 NOTE — Progress Notes (Signed)
Patient ID: FRONIA DEPASS, female   DOB: May 08, 1960, 57 y.o.   MRN: [id number]   Assessment:  1) Annual Gyn Exam 2) Weight gain (14 lbs in the past year) Body mass index is 33.98 kg/m.  3) 1 cm tender nodule on breast. Likely sebaceous cyst. Plan:  1. pap smear done, next pap due 3 years 2. Return annually to discuss medication or prn 3.   Mammogram in the next month. 4.   Water exercises for weight loss 5.   Refill Rx Phentermine daily  6.   Refill Valtrex  Subjective:  [name] is a 57 y.o. female [id number] who presents for annual exam. No LMP recorded. Patient has had a hysterectomy. The patient has complaints today of weight gain. She needs a knee replacement but cannot get it until she loses weight. She plays with her grandchild, which gets her out of breath and she agrees to try water exercises. She is sexually active once every ~4-5 years.  The following portions of the patient's history were reviewed and updated as appropriate: allergies, current medications, past family history, past medical history, past social history, past surgical history and problem list. Past Medical History:  Diagnosis Date  . Anxiety   . COPD (chronic obstructive pulmonary disease) ([place])   . Fibromyalgia   . Osteoporosis     Past Surgical History:  Procedure Laterality Date  . ABDOMINAL HYSTERECTOMY      Current Outpatient Medications:  .  acyclovir (ZOVIRAX) 400 MG tablet, TAKE (1) TABLET TWICE DAILY., Disp: 60 tablet, Rfl: 12 .  ALBUTEROL SULFATE HFA IN, Inhale into the lungs as needed. , Disp: , Rfl:  .  ALPRAZolam (XANAX) 1 MG tablet, Take 1 mg by mouth as needed for anxiety., Disp: , Rfl:  .  buprenorphine-naloxone (SUBOXONE) 2-0.5 MG SUBL SL tablet, Place 0.5 tablets under the tongue daily. , Disp: , Rfl:  .  estradiol (ESTRACE) 1 MG tablet, TAKE (1) TABLET BY MOUTH ONCE DAILY., Disp: 90 tablet, Rfl: 3 .  lovastatin (MEVACOR) 10 MG tablet, Take 10 mg by mouth at bedtime., Disp: ,  Rfl:  .  mometasone-formoterol (DULERA) 200-5 MCG/ACT AERO, Inhale 2 puffs into the lungs 2 (two) times daily., Disp: , Rfl:  .  Multiple Vitamins-Minerals (ONE-A-DAY WOMENS 50+ ADVANTAGE PO), Take by mouth daily., Disp: , Rfl:  .  cyclobenzaprine (FLEXERIL) 10 MG tablet, Take 10 mg by mouth 3 (three) times daily as needed for muscle spasms., Disp: , Rfl:   Review of Systems Constitutional: negative Gastrointestinal: negative Genitourinary: negative  Objective:  BP (!) 156/94 (BP Location: Left Arm, Patient Position: Sitting, Cuff Size: Normal)   Pulse 82   Ht 5\' 3"  (1.6 m)   Wt 191 lb 12.8 oz (87 kg)   BMI 33.98 kg/m    BMI: Body mass index is 33.98 kg/m.  General Appearance: Alert, appropriate appearance for age. No acute distress HEENT: Grossly normal Neck / Thyroid:  Cardiovascular: RRR; normal S1, S2, no murmur Lungs: CTA bilaterally Back: No CVAT Breast Exam: 1 cm tender nodule medial aspect of breast.within 3 cm of sternum, outside breast tissue. It is mobile. Likely sebaceous cyst / fatty tumor. Gastrointestinal: Soft, non-tender, no masses or organomegaly Pelvic Exam: Vulva and vagina appear normal. Bimanual exam reveals normal uterus and adnexa. Rectovaginal: not indicated and guaiac negative stool obtained Lymphatic Exam: Non-palpable nodes in neck, clavicular, axillary, or inguinal regions  Skin: no rash or abnormalities Neurologic: Normal gait and speech, no tremor  Psychiatric: Alert and oriented, appropriate affect.  Urinalysis:Not done   Specific discussion of lifestyle changes, behavioral modifications and healthy eating habits as noted above. Greater than 50% was spent in counseling and coordination of care with the patient.  Total time greater than: 10 minutes.   By signing my name below, I, De Burrs, attest that this documentation has been prepared under the direction and in the presence of Jonnie Kind, MD. Electronically Signed: De Burrs,  Medical Scribe. 11/20/17. 4:14 PM.  I personally performed the services described in this documentation, which was SCRIBED in my presence. The recorded information has been reviewed and considered accurate. It has been edited as necessary during review. Jonnie Kind, MD

## 2017-11-24 ENCOUNTER — Telehealth: Payer: Self-pay | Admitting: Obstetrics and Gynecology

## 2017-11-24 DIAGNOSIS — N63 Unspecified lump in unspecified breast: Secondary | ICD-10-CM

## 2017-11-24 NOTE — Telephone Encounter (Signed)
Dr Carmell Austria told pt she needed to have a mammo found lump on breast exam,  Told pt she could call and make appt but she called and has to have referral from [place] can someone please schedule that and call her with date and time

## 2017-11-25 NOTE — Telephone Encounter (Signed)
Patient scheduled for diagnostic on 10/29 at 9:20. Pt to arrive at 9am, no lotions, powders or deo.

## 2017-12-09 ENCOUNTER — Ambulatory Visit (HOSPITAL_COMMUNITY)
Admission: RE | Admit: 2017-12-09 | Discharge: 2017-12-09 | Disposition: A | Discharge status: Home / Self Care | Payer: Medicaid Other | Source: Ambulatory Visit | Attending: Obstetrics and Gynecology | Admitting: Obstetrics and Gynecology

## 2017-12-09 DIAGNOSIS — N63 Unspecified lump in unspecified breast: Secondary | ICD-10-CM | POA: Diagnosis present

## 2017-12-09 DIAGNOSIS — N6322 Unspecified lump in the left breast, upper inner quadrant: Secondary | ICD-10-CM | POA: Insufficient documentation

## 2017-12-31 ENCOUNTER — Encounter: Payer: Self-pay | Admitting: *Deleted

## 2018-01-02 ENCOUNTER — Encounter: Payer: Self-pay | Admitting: Cardiology

## 2018-01-02 NOTE — Progress Notes (Signed)
Cardiology Office Note  Date: 01/05/2018   ID: [name], DOB 25-Aug-1960, MRN [id number]  PCP: [facility] Cardiologist: Rozann Lesches, MD   Chief Complaint  Patient presents with  . Aortic atherosclerosis    History of Present Illness: [name] is a 57 y.o. female referred for cardiology consultation by Ms. Allen NP with the health department due to incidental findings of aortic atherosclerosis by plain film imaging.  Patient had been seen in the ER at [facility] in late September with back pain that occurred after lifting.  Spine films showed no obvious fracture or spinal misalignment, incidentally described calcified atherosclerosis of the abdominal aorta.  She presents today stating that she has been working on a diet to lose weight, anticipates need for knee surgery eventually.  She does not report any exertional chest pain.  No abdominal pain.  She has been on aspirin and Mevacor since last month, tolerating both.  She quit smoking about 6 years ago.  Recent lab work is reviewed below.  I personally reviewed her ECG today which shows normal sinus rhythm.  Past Medical History:  Diagnosis Date  . Anxiety   . COPD (chronic obstructive pulmonary disease) ([place])   . Depression   . Fibromyalgia   . Hepatitis C   . History of herpes simplex type 2 infection   . History of pancreatitis   . History of pelvic fracture   . Hyperlipidemia   . Osteoporosis     Past Surgical History:  Procedure Laterality Date  . ABDOMINAL HYSTERECTOMY    . BILATERAL SALPINGOOPHORECTOMY    . MANDIBLE SURGERY      Current Outpatient Medications  Medication Sig Dispense Refill  . acyclovir (ZOVIRAX) 400 MG tablet TAKE (1) TABLET TWICE DAILY. 60 tablet 12  . ALBUTEROL SULFATE HFA IN Inhale into the lungs as needed.     . ALPRAZolam (XANAX) 1 MG tablet Take 1 mg by mouth as needed for anxiety.    Marland Kitchen aspirin EC 81 MG tablet Take  81 mg by mouth daily.    . buprenorphine-naloxone (SUBOXONE) 2-0.5 MG SUBL SL tablet Place 0.5 tablets under the tongue daily.     . cyclobenzaprine (FLEXERIL) 10 MG tablet Take 10 mg by mouth 3 (three) times daily as needed for muscle spasms.    Marland Kitchen estradiol (ESTRACE) 1 MG tablet TAKE (1) TABLET BY MOUTH ONCE DAILY. 90 tablet 3  . lovastatin (MEVACOR) 10 MG tablet Take 10 mg by mouth at bedtime.    . mometasone-formoterol (DULERA) 200-5 MCG/ACT AERO Inhale 2 puffs into the lungs 2 (two) times daily.    . Multiple Vitamins-Minerals (ONE-A-DAY WOMENS 50+ ADVANTAGE PO) Take by mouth daily.    . phentermine 37.5 MG capsule Take 1 capsule (37.5 mg total) by mouth every morning. 2 hours before breakfast with 8 ounces water, goal : 5 lb weight loss/month 30 capsule 2   No current facility-administered medications for this visit.    Allergies:  Other   Social History: The patient  reports that she quit smoking about 5 years ago. Her smoking use included cigarettes. She quit after 33.00 years of use. She has never used smokeless tobacco. She reports that she does not drink alcohol or use drugs.   Family History: The patient's family history includes Cancer in her mother; Heart attack in her father; Heart disease in her father.   ROS:  Please see the history of present illness. Otherwise,  complete review of systems is positive for chronic back pain.  All other systems are reviewed and negative.   Physical Exam: VS:  BP 114/72   Pulse 95   Ht 5\' 2"  (1.575 m)   Wt 179 lb (81.2 kg)   SpO2 98%   BMI 32.74 kg/m , BMI Body mass index is 32.74 kg/m.  Wt Readings from Last 3 Encounters:  01/05/18 179 lb (81.2 kg)  11/20/17 191 lb 12.8 oz (87 kg)  09/30/16 177 lb (80.3 kg)    General: Patient appears comfortable at rest. HEENT: Conjunctiva and lids normal, oropharynx clear. Neck: Supple, no elevated JVP or carotid bruits, no thyromegaly. Lungs: Clear to auscultation, nonlabored breathing at  rest. Cardiac: Regular rate and rhythm, no S3 or significant systolic murmur, no pericardial rub. Abdomen: Soft, nontender, bowel sounds present, no guarding or rebound. Extremities: No pitting edema, distal pulses 2+. Skin: Warm and dry. Musculoskeletal: No kyphosis. Neuropsychiatric: Alert and oriented x3, affect grossly appropriate.  ECG: There is no old tracing available for comparison.  Recent Labwork:    Component Value Date/Time   CHOL 190 05/17/2015 1445   TRIG 90 05/17/2015 1445   HDL 74 05/17/2015 1445   CHOLHDL 2.6 05/17/2015 1445   CHOLHDL 4.8 11/03/2006 0600   VLDL 41 (H) 11/03/2006 0600   LDLCALC 98 05/17/2015 1445  October 2019: Cholesterol 232, HDL 94, triglycerides 80, LDL 121, BUN 17, creatinine 0.72, potassium 5.4, AST 21, ALT 18, hemoglobin 12.9, platelets 366, TSH 2.66  Other Studies Reviewed Today:  Spine films 11/09/2017: No fracture or traumatic misalignment in the cervical spine, fecal loading in the right colon, calcified atherosclerosis in the abdominal aorta.  Assessment and Plan:  1.  Aortic atherosclerosis documented by plain film imaging in September.  Patient has a prior history of tobacco use, but quit 6 years ago.  Recent LDL was 121 and she was placed on Mevacor, also baby aspirin.  She is working through diet for additional weight loss as well.  She does not report any exertional chest pain or recent abdominal pain.  Plan is to obtain an abdominal ultrasound to assess aortic size, rule out aneurysm that would require closer follow-up.  In the absence of this finding, would recommend continued risk factor modification, medical therapy is reasonable per PCP.  2.  Tobacco abuse in remission.  3.  Mixed hyperlipidemia, LDL 121, although HDL 94.  Now on Mevacor per PCP.  Current medicines were reviewed with the patient today.   Orders Placed This Encounter  Procedures  . EKG 12-Lead    Disposition: Call with test results.  Signed, Satira Sark, MD, [facility] 01/05/2018 3:21 PM    [facility] [address] [place], [place], [place] [address] Phone: [phone]; Fax: [phone]

## 2018-01-05 ENCOUNTER — Telehealth: Payer: Self-pay | Admitting: Cardiology

## 2018-01-05 ENCOUNTER — Encounter: Payer: Self-pay | Admitting: Cardiology

## 2018-01-05 ENCOUNTER — Ambulatory Visit (INDEPENDENT_AMBULATORY_CARE_PROVIDER_SITE_OTHER): Payer: Medicaid Other | Admitting: Cardiology

## 2018-01-05 VITALS — BP 114/72 | HR 95 | Ht 62.0 in | Wt 179.0 lb

## 2018-01-05 DIAGNOSIS — I700 Atherosclerosis of aorta: Secondary | ICD-10-CM

## 2018-01-05 DIAGNOSIS — F17201 Nicotine dependence, unspecified, in remission: Secondary | ICD-10-CM | POA: Diagnosis not present

## 2018-01-05 DIAGNOSIS — E782 Mixed hyperlipidemia: Secondary | ICD-10-CM

## 2018-01-05 NOTE — Telephone Encounter (Signed)
Pre-cert Verification for the following procedure    AAA scheduled for 01-22-2018

## 2018-01-05 NOTE — Patient Instructions (Addendum)
Medication Instructions:   Your physician recommends that you continue on your current medications as directed. Please refer to the Current Medication list given to you today.  Labwork:  NONE  Testing/Procedures:  Your physician has requested that you have an abdominal aorta duplex. During this test, an ultrasound is used to evaluate the aorta. Allow 30 minutes for this exam. Do not eat after midnight the day before and avoid carbonated beverage.   Follow-Up:  Your physician recommends that you schedule a follow-up appointment in: pending test result.  Any Other Special Instructions Will Be Listed Below (If Applicable).  If you need a refill on your cardiac medications before your next appointment, please call your pharmacy.

## 2018-01-22 ENCOUNTER — Telehealth: Payer: Self-pay

## 2018-01-22 ENCOUNTER — Other Ambulatory Visit: Payer: Medicaid Other

## 2018-01-22 NOTE — Telephone Encounter (Signed)
Patient had AAA scheduled for 01-22-2018 .She had to cancel and reschedule for 02/05/2018

## 2018-02-05 ENCOUNTER — Ambulatory Visit (INDEPENDENT_AMBULATORY_CARE_PROVIDER_SITE_OTHER): Payer: Medicaid Other

## 2018-02-05 DIAGNOSIS — I700 Atherosclerosis of aorta: Secondary | ICD-10-CM | POA: Diagnosis not present

## 2018-02-09 ENCOUNTER — Other Ambulatory Visit: Payer: Self-pay | Admitting: Obstetrics and Gynecology

## 2018-02-12 ENCOUNTER — Telehealth: Payer: Self-pay | Admitting: *Deleted

## 2018-02-12 ENCOUNTER — Telehealth: Payer: Self-pay | Admitting: Obstetrics and Gynecology

## 2018-02-12 NOTE — Telephone Encounter (Signed)
-----   Message from Satira Sark, MD sent at 02/05/2018 10:03 AM EST ----- Results reviewed.  No evidence of abdominal aortic aneurysm.  We will continue with plans for diet and medical therapy.  Keep follow-up with PCP.

## 2018-02-12 NOTE — Telephone Encounter (Signed)
Pt calling to see if she can get a refill on her diet pills/ she uses [facility] , they told her she had to pick up a script

## 2018-02-13 NOTE — Telephone Encounter (Signed)
Patient informed. Copy sent to PCP °

## 2018-03-16 ENCOUNTER — Other Ambulatory Visit: Payer: Self-pay | Admitting: Obstetrics and Gynecology

## 2018-11-12 ENCOUNTER — Other Ambulatory Visit: Payer: Self-pay | Admitting: Obstetrics and Gynecology

## 2018-12-17 ENCOUNTER — Telehealth: Payer: Self-pay | Admitting: Obstetrics and Gynecology

## 2018-12-17 NOTE — Telephone Encounter (Signed)

## 2018-12-18 ENCOUNTER — Other Ambulatory Visit: Payer: Medicaid Other | Admitting: Obstetrics and Gynecology

## 2019-02-22 ENCOUNTER — Telehealth: Payer: Self-pay | Admitting: Obstetrics and Gynecology

## 2019-02-22 NOTE — Telephone Encounter (Signed)
Called patient regarding appointment scheduled in our office and advised to come alone to the visit, however, a support person, over age 60, may accompany her to appointment if assistance is needed for safety or care concerns. Otherwise, support persons should remain outside until the visit is complete.   Prescreen questions asked: 1. Any of the following symptoms of COVID such as chills, fever, cough, shortness of breath, muscle pain, diarrhea, rash, vomiting, abdominal pain, red eye, weakness, bruising, bleeding, joint pain, loss of taste or smell, a severe headache, sore throat, fatigue 2. Any exposure to anyone suspected or confirmed of having COVID-19 3. Awaiting test results for COVID-19  Also,to keep you safe, please use the provided hand sanitizer when you enter the office. We are asking everyone in the office to wear a mask to help prevent the spread of germs. If you have a mask of your own, please wear it to your appointment, if not, we are happy to provide one for you.  Thank you for understanding and your cooperation.    [facility] Staff      

## 2019-02-23 ENCOUNTER — Other Ambulatory Visit: Payer: Medicaid Other | Admitting: Obstetrics and Gynecology

## 2019-03-22 ENCOUNTER — Telehealth: Payer: Self-pay | Admitting: Obstetrics and Gynecology

## 2019-03-22 NOTE — Telephone Encounter (Signed)

## 2019-03-23 ENCOUNTER — Other Ambulatory Visit: Payer: Medicaid Other | Admitting: Obstetrics and Gynecology

## 2019-04-19 ENCOUNTER — Other Ambulatory Visit (HOSPITAL_COMMUNITY): Payer: Self-pay | Admitting: *Deleted

## 2019-04-19 ENCOUNTER — Encounter (HOSPITAL_COMMUNITY): Payer: Self-pay | Admitting: *Deleted

## 2019-04-19 DIAGNOSIS — Z122 Encounter for screening for malignant neoplasm of respiratory organs: Secondary | ICD-10-CM

## 2019-04-19 DIAGNOSIS — Z87891 Personal history of nicotine dependence: Secondary | ICD-10-CM

## 2019-04-19 NOTE — Progress Notes (Addendum)
Received referral for initial lung cancer screening scan.  Contacted patient and obtained smoking history (started age 59 1ppd for about 3 years, then increased to 2 ppd until she quit in 2014, 61 pack year) as well as answering questions related to the screening process.  Patient denies signs/symptoms of lung cancer such as weight loss or hemoptysis.  Patient denies comorbidity that would prevent curative treatment if lung cancer were to be found.  Patient is currently being treated with steroids and antibiotics for bronchitis.  I informed her that we will wait a few weeks for her to get well and then we can do her scan. She verbalizes understanding.  Patient is scheduled for shared decision making visit and CT scan on 4/23 at 1030.

## 2019-04-20 ENCOUNTER — Telehealth: Payer: Self-pay | Admitting: Obstetrics and Gynecology

## 2019-04-20 NOTE — Telephone Encounter (Signed)

## 2019-04-21 ENCOUNTER — Other Ambulatory Visit: Payer: Medicaid Other | Admitting: Obstetrics and Gynecology

## 2019-05-18 ENCOUNTER — Telehealth: Payer: Self-pay | Admitting: Obstetrics and Gynecology

## 2019-05-18 NOTE — Telephone Encounter (Signed)

## 2019-05-19 ENCOUNTER — Other Ambulatory Visit: Payer: Medicaid Other | Admitting: Obstetrics and Gynecology

## 2019-06-04 ENCOUNTER — Other Ambulatory Visit: Payer: Self-pay

## 2019-06-04 ENCOUNTER — Inpatient Hospital Stay (HOSPITAL_COMMUNITY): Payer: Medicaid Other | Attending: Nurse Practitioner | Admitting: Nurse Practitioner

## 2019-06-04 ENCOUNTER — Ambulatory Visit (HOSPITAL_COMMUNITY)
Admission: RE | Admit: 2019-06-04 | Discharge: 2019-06-04 | Disposition: A | Discharge status: Home / Self Care | Payer: Medicaid Other | Source: Ambulatory Visit | Attending: Nurse Practitioner | Admitting: Nurse Practitioner

## 2019-06-04 DIAGNOSIS — Z122 Encounter for screening for malignant neoplasm of respiratory organs: Secondary | ICD-10-CM | POA: Insufficient documentation

## 2019-06-04 DIAGNOSIS — Z87891 Personal history of nicotine dependence: Secondary | ICD-10-CM | POA: Insufficient documentation

## 2019-06-04 NOTE — Progress Notes (Signed)
[facility] NOTE  Patient Care Team: Health, [facility] as PCP - General Domenic Polite, Aloha Gell, MD as PCP - Cardiology (Cardiology)  CHIEF COMPLAINTS/PURPOSE OF CONSULTATION: Shared decision making visit for lung cancer screening.  HISTORY OF PRESENTING ILLNESS:  [name] 59 y.o. female she presents today for shared decision-making visit for lung cancer screening. She has a past medical history significant for COPD, depression, fibromyalgia, hepatitis C, and hyperlipidemia.  Patient states she started smoking at the age of 30.  She has smoked 1-1/2 packs of cigarettes a day for the last 32 years.  She stopped smoking in 2014.  She reports she occasionally gets a cough and pain in her back.  She denies any alcohol or illicit drug use.  She has a family history significant for a mother with small cell lung cancer from smoking.  And a paternal uncle with colon cancer.  MEDICAL HISTORY:  Past Medical History:  Diagnosis Date  . Anxiety   . COPD (chronic obstructive pulmonary disease) ([place])   . Depression   . Fibromyalgia   . Hepatitis C   . History of herpes simplex type 2 infection   . History of pancreatitis   . History of pelvic fracture   . Hyperlipidemia   . Osteoporosis     SURGICAL HISTORY: Past Surgical History:  Procedure Laterality Date  . ABDOMINAL HYSTERECTOMY    . BILATERAL SALPINGOOPHORECTOMY    . MANDIBLE SURGERY      SOCIAL HISTORY: Social History   Socioeconomic History  . Marital status: Married    Spouse name: Not on file  . Number of children: Not on file  . Years of education: Not on file  . Highest education level: Not on file  Occupational History  . Not on file  Tobacco Use  . Smoking status: Former Smoker    Years: 33.00    Types: Cigarettes    Quit date: 11/11/2012    Years since quitting: 6.5  . Smokeless tobacco: Never Used  Substance and Sexual Activity  . Alcohol use: No  . Drug use: No  .  Sexual activity: Not Currently    Birth control/protection: Surgical    Comment: hyst  Other Topics Concern  . Not on file  Social History Narrative  . Not on file   Social Determinants of Health   Financial Resource Strain:   . Difficulty of Paying Living Expenses:   Food Insecurity:   . Worried About Charity fundraiser in the Last Year:   . Arboriculturist in the Last Year:   Transportation Needs:   . Film/video editor (Medical):   Marland Kitchen Lack of Transportation (Non-Medical):   Physical Activity:   . Days of Exercise per Week:   . Minutes of Exercise per Session:   Stress:   . Feeling of Stress :   Social Connections:   . Frequency of Communication with Friends and Family:   . Frequency of Social Gatherings with Friends and Family:   . Attends Religious Services:   . Active Member of Clubs or Organizations:   . Attends Archivist Meetings:   Marland Kitchen Marital Status:   Intimate Partner Violence:   . Fear of Current or Ex-Partner:   . Emotionally Abused:   Marland Kitchen Physically Abused:   . Sexually Abused:     FAMILY HISTORY: Family History  Problem Relation Age of Onset  . Cancer Mother   . Heart disease Father   .  Heart attack Father     ALLERGIES:  is allergic to other.  MEDICATIONS:  Current Outpatient Medications  Medication Sig Dispense Refill  . acyclovir (ZOVIRAX) 400 MG tablet TAKE (1) TABLET TWICE DAILY. 60 tablet 12  . ALBUTEROL SULFATE HFA IN Inhale into the lungs as needed.     . ALPRAZolam (XANAX) 1 MG tablet Take 1 mg by mouth as needed for anxiety.    Marland Kitchen aspirin EC 81 MG tablet Take 81 mg by mouth daily.    . buprenorphine-naloxone (SUBOXONE) 2-0.5 MG SUBL SL tablet Place 0.5 tablets under the tongue daily.     . cyclobenzaprine (FLEXERIL) 10 MG tablet Take 10 mg by mouth 3 (three) times daily as needed for muscle spasms.    Marland Kitchen estradiol (ESTRACE) 1 MG tablet TAKE 1 TABLET ONCE DAILY. 30 tablet 11  . lovastatin (MEVACOR) 10 MG tablet Take 10 mg by  mouth at bedtime.    . mometasone-formoterol (DULERA) 200-5 MCG/ACT AERO Inhale 2 puffs into the lungs 2 (two) times daily.    . Multiple Vitamins-Minerals (ONE-A-DAY WOMENS 50+ ADVANTAGE PO) Take by mouth daily.    . phentermine (ADIPEX-P) 37.5 MG tablet TAKE 1 TABLET BY MOUTH EVERY MORNING 2 HOURS BEFORE BREAKFAST WITH 8 OZ OF WATER: 5LB WEIGHT LOSS PER MONTH. 30 tablet 0   No current facility-administered medications for this visit.    LABORATORY DATA:  I have reviewed the data as listed Lab Results  Component Value Date   WBC 8.4 05/17/2015   HGB 13.1 05/17/2015   HCT 39.2 05/17/2015   MCV 96 05/17/2015   PLT 316 05/17/2015     Chemistry      Component Value Date/Time   NA 141 05/17/2015 1445   K 5.6 (H) 05/17/2015 1445   CL 101 05/17/2015 1445   CO2 25 05/17/2015 1445   BUN 12 05/17/2015 1445   CREATININE 0.56 (L) 05/17/2015 1445      Component Value Date/Time   CALCIUM 9.2 05/17/2015 1445   ALKPHOS 65 05/17/2015 1445   AST 19 05/17/2015 1445   ALT 19 05/17/2015 1445   BILITOT 0.3 05/17/2015 1445       ASSESSMENT & PLAN:  Smoking history 1.  Tobacco abuse: -This patient meets the criteria for low-dose CT lung cancer screening. -She is asymptomatic for any signs and symptoms of lung cancer. -The shared decision making visit discussion includes risk and benefits of screening, potential for follow-up, diagnostic testing for abnormal scans, potential for false positive test, over diagnosis, and discussion about total radiation exposure. -Patient stated willingness to undergo diagnostic and treatment as needed. -Patient is counseled on smoking cessation to decrease risk of lung cancer, pulmonary disease, heart disease and stroke. -Patient was given a resource card with information on receiving free nicotine replacement therapy and information about free smoking cessation classes. -Patient will present for LD CT scan today and will follow up with PCP.   All questions  were answered. The patient knows to call the clinic with any problems, questions or concerns. I spent 10 mins counseling the patient face to face. The total time spent in the appointment was 20 mins and more than 50% was on counseling.     Glennie Isle, NP-C 06/04/2019 11:04 AM

## 2019-06-04 NOTE — Patient Instructions (Signed)
You were seen today for your shared decision making visit and a low-dose CT scan for lung cancer screening.    

## 2019-06-04 NOTE — Assessment & Plan Note (Signed)
1.  Tobacco abuse: -This patient meets the criteria for low-dose CT lung cancer screening. -She is asymptomatic for any signs and symptoms of lung cancer. -The shared decision making visit discussion includes risk and benefits of screening, potential for follow-up, diagnostic testing for abnormal scans, potential for false positive test, over diagnosis, and discussion about total radiation exposure. -Patient stated willingness to undergo diagnostic and treatment as needed. -Patient is counseled on smoking cessation to decrease risk of lung cancer, pulmonary disease, heart disease and stroke. -Patient was given a resource card with information on receiving free nicotine replacement therapy and information about free smoking cessation classes. -Patient will present for LD CT scan today and will follow up with PCP.

## 2019-06-07 ENCOUNTER — Encounter (HOSPITAL_COMMUNITY): Payer: Self-pay | Admitting: *Deleted

## 2019-06-07 NOTE — Progress Notes (Signed)
Notified of LDCT results by Diane at [facility].   Our provider aware, orders received to notify referring physician. Patient notified via telephone of LDCT lung cancer screening results with recommendations to follow up in 12 months.  She was also notified of incidental findings and need for more urgent follow up with PCP.  I called and spoke with Anderson Malta, RN at [facility] and advised of results.  A copy was faxed over to their office.

## 2019-06-21 ENCOUNTER — Other Ambulatory Visit: Payer: Self-pay

## 2019-06-21 ENCOUNTER — Ambulatory Visit (INDEPENDENT_AMBULATORY_CARE_PROVIDER_SITE_OTHER): Payer: Medicaid Other | Admitting: Obstetrics and Gynecology

## 2019-06-21 ENCOUNTER — Encounter: Payer: Self-pay | Admitting: Obstetrics and Gynecology

## 2019-06-21 VITALS — BP 158/95 | HR 99 | Ht 63.0 in | Wt 217.6 lb

## 2019-06-21 DIAGNOSIS — Z0000 Encounter for general adult medical examination without abnormal findings: Secondary | ICD-10-CM

## 2019-06-21 DIAGNOSIS — I2510 Atherosclerotic heart disease of native coronary artery without angina pectoris: Secondary | ICD-10-CM | POA: Diagnosis not present

## 2019-06-21 DIAGNOSIS — Z90710 Acquired absence of both cervix and uterus: Secondary | ICD-10-CM | POA: Diagnosis not present

## 2019-06-21 DIAGNOSIS — Z01419 Encounter for gynecological examination (general) (routine) without abnormal findings: Secondary | ICD-10-CM

## 2019-06-21 MED ORDER — ACYCLOVIR 400 MG PO TABS: ORAL_TABLET | ORAL | 12 refills | Status: DC

## 2019-06-21 MED ORDER — ESTRADIOL 1 MG PO TABS: 1.0000 mg | ORAL_TABLET | Freq: Every day | ORAL | 11 refills | Status: DC

## 2019-06-21 NOTE — Progress Notes (Addendum)
PATIENT ID: [name], female     DOB: 04/04/1960, 59 y.o.     MRN: [id number]  Subjective:     [name] is a 59 y.o. female here for a routine exam.  Current complaints: back pain from osteoporosis and degenerative disc x 3 vertebrae, with compression fractures. ASCVD with Aortic hardening..  Personal health questionnaire reviewed: yes.   Gynecologic History No LMP recorded. Patient has had a hysterectomy. "complete" Contraception: status post hysterectomy Last Pap: 11/20/2017. Results were: normal Last mammogram: 12/09/2017. Results were: Benign fat necrosis involving a subcutaneous fat lobule in the upper inner left breast which accounts for the palpable concern. No evidence of malignancy.   Obstetric History OB History  Gravida Para Term Preterm AB Living  4 4 4     4   SAB TAB Ectopic Multiple Live Births          4    # Outcome Date GA Lbr Len/2nd Weight Sex Delivery Anes PTL Lv  4 Term     F Vag-Spont   LIV  3 Term     M Vag-Spont   LIV  2 Term     M Vag-Spont   LIV  1 Term     M Vag-Spont   LIV    Review of Systems  Review of Systems  Constitutional: Negative for fever, chills, weight loss, malaise/fatigue and diaphoresis.  HENT: Negative for hearing loss, ear pain, nosebleeds, congestion, sore throat, neck pain, tinnitus and ear discharge.   Eyes: Negative for blurred vision, double vision, photophobia, pain, discharge and redness.  Respiratory: Negative for cough, hemoptysis, sputum production, shortness of breath, wheezing and stridor. LUNG CANCER CHEST CT, SHOWED COPD, AND NO  Masses.  Cardiovascular: Negative for chest pain, palpitations, orthopnea, claudication, leg swelling and PND.  Gastrointestinal: negative for abdominal pain. Negative for heartburn, nausea, vomiting, diarrhea, constipation, blood in stool and melena.  Genitourinary: Negative for dysuria, urgency, frequency, hematuria and flank pain.  Musculoskeletal: Negative for myalgias, back pain,  joint pain and falls.  Skin: Negative for itching and rash.  Neurological: Negative for dizziness, tingling, tremors, sensory change, speech change, focal weakness, seizures, loss of consciousness, weakness and headaches.  Endo/Heme/Allergies: Negative for environmental allergies and polydipsia. Does not bruise/bleed easily.  Psychiatric/Behavioral: Negative for depression, suicidal ideas, hallucinations, memory loss and substance abuse. The patient is not nervous/anxious and does not have insomnia.       stil has vasomotor sx if not on ht. Objective:    Physical Exam  Vitals reviewed. Constitutional: She is oriented to person, place, and time. She appears well-developed and well-nourished.  HENT:  Head: Normocephalic and atraumatic.        Right Ear: External ear normal.  Left Ear: External ear normal.  Nose: Nose normal.  Mouth/Throat: Oropharynx is clear and moist.  Eyes: Conjunctivae and EOM are normal. Pupils are equal, round, and reactive to light. Right eye exhibits no discharge. Left eye exhibits no discharge. No scleral icterus.  Neck: Normal range of motion. Neck supple. No tracheal deviation present. No thyromegaly present.  Cardiovascular: Normal rate, regular rhythm, normal heart sounds and intact distal pulses.  Exam reveals no gallop and no friction rub.   No murmur heard. Respiratory: Effort normal and breath sounds normal. No respiratory distress. She has no wheezes. She has no rales. She exhibits no tenderness.  GI: Soft. Bowel sounds are normal. She exhibits no distension and no mass. There is no tenderness. There is no rebound and  no guarding.  Genitourinary:       Vulva is normal without lesions Vagina is pink moist without discharge Cervix is absent, cuff support excellent, flexible and pap is not done Uterus is normal size shape and contour Adnexa is negative with normal sized ovaries by sonogram  Musculoskeletal: Normal range of motion. She exhibits no edema and  no tenderness.  Neurological: She is alert and oriented to person, place, and time. She has normal reflexes. She displays normal reflexes. No cranial nerve deficit. She exhibits normal muscle tone. Coordination normal.  Skin: Skin is warm and dry. No rash noted. No erythema. No pallor. Tinea corporis fungus under breast.  Psychiatric: She has a normal mood and affect. Her behavior is normal. Judgment and thought content normal.       Assessment:    Healthy female exam.   ascvd Degenerative bone disease.   Plan:    sees Dr Rosemarie Beath at health dept.  to discuss Alendronate,and such meds. Has been on HT will continue due to severity of vasomotor sx Continue Supression with acyclvir bid 400 mg.   By signing my name below, I, [employer], attest that this documentation has been prepared under the direction and in the presence of Chancy Milroy, MD. Electronically Signed: [place]. 06/21/19. 11:25 AM.  I personally performed the services described in this documentation, which was SCRIBED in my presence. The recorded information has been reviewed and considered accurate. It has been edited as necessary during review. Jonnie Kind, MD

## 2019-07-19 ENCOUNTER — Other Ambulatory Visit (HOSPITAL_COMMUNITY): Payer: Self-pay | Admitting: Obstetrics and Gynecology

## 2019-07-19 DIAGNOSIS — Z1231 Encounter for screening mammogram for malignant neoplasm of breast: Secondary | ICD-10-CM

## 2019-08-25 ENCOUNTER — Ambulatory Visit (HOSPITAL_COMMUNITY): Payer: Medicaid Other

## 2019-09-24 ENCOUNTER — Ambulatory Visit (HOSPITAL_COMMUNITY): Payer: Medicaid Other

## 2019-10-15 ENCOUNTER — Inpatient Hospital Stay (HOSPITAL_COMMUNITY): Admission: RE | Admit: 2019-10-15 | Payer: Medicaid Other | Source: Ambulatory Visit

## 2019-11-08 ENCOUNTER — Other Ambulatory Visit (HOSPITAL_COMMUNITY): Payer: Self-pay | Admitting: Physician Assistant

## 2019-11-08 DIAGNOSIS — Z13820 Encounter for screening for osteoporosis: Secondary | ICD-10-CM

## 2019-11-12 ENCOUNTER — Other Ambulatory Visit (HOSPITAL_COMMUNITY): Payer: Medicaid Other

## 2019-11-18 ENCOUNTER — Other Ambulatory Visit (HOSPITAL_COMMUNITY): Payer: Medicaid Other

## 2020-05-01 ENCOUNTER — Other Ambulatory Visit (HOSPITAL_COMMUNITY): Payer: Self-pay

## 2020-05-01 DIAGNOSIS — Z122 Encounter for screening for malignant neoplasm of respiratory organs: Secondary | ICD-10-CM

## 2020-05-01 DIAGNOSIS — Z87891 Personal history of nicotine dependence: Secondary | ICD-10-CM

## 2020-05-01 NOTE — Progress Notes (Signed)
I have attempted to reach the patient regarding follow-up LDCT. Unable to reach the patient at this time. Detailed VM left asking that the patient return my call.

## 2020-05-11 ENCOUNTER — Ambulatory Visit (HOSPITAL_COMMUNITY): Payer: Medicaid Other

## 2020-05-11 ENCOUNTER — Other Ambulatory Visit (HOSPITAL_COMMUNITY): Payer: Medicaid Other

## 2020-05-29 ENCOUNTER — Ambulatory Visit (HOSPITAL_COMMUNITY): Payer: Medicaid Other

## 2020-05-29 ENCOUNTER — Other Ambulatory Visit (HOSPITAL_COMMUNITY): Payer: Medicaid Other

## 2020-06-15 ENCOUNTER — Other Ambulatory Visit (HOSPITAL_COMMUNITY): Payer: Medicaid Other

## 2020-06-15 ENCOUNTER — Ambulatory Visit (HOSPITAL_COMMUNITY): Payer: Medicaid Other

## 2020-06-20 ENCOUNTER — Ambulatory Visit (HOSPITAL_COMMUNITY): Payer: Medicaid Other

## 2020-06-22 ENCOUNTER — Ambulatory Visit (HOSPITAL_COMMUNITY)
Admission: RE | Admit: 2020-06-22 | Discharge: 2020-06-22 | Disposition: A | Discharge status: Home / Self Care | Payer: Medicaid Other | Source: Ambulatory Visit | Attending: Obstetrics and Gynecology | Admitting: Obstetrics and Gynecology

## 2020-06-22 ENCOUNTER — Ambulatory Visit (HOSPITAL_COMMUNITY)
Admission: RE | Admit: 2020-06-22 | Discharge: 2020-06-22 | Disposition: A | Discharge status: Home / Self Care | Payer: Medicaid Other | Source: Ambulatory Visit | Attending: Physician Assistant | Admitting: Physician Assistant

## 2020-06-22 DIAGNOSIS — Z13820 Encounter for screening for osteoporosis: Secondary | ICD-10-CM | POA: Diagnosis present

## 2020-06-22 DIAGNOSIS — Z1231 Encounter for screening mammogram for malignant neoplasm of breast: Secondary | ICD-10-CM | POA: Diagnosis present

## 2020-06-26 ENCOUNTER — Ambulatory Visit (HOSPITAL_COMMUNITY): Payer: Medicaid Other

## 2020-06-29 ENCOUNTER — Inpatient Hospital Stay (HOSPITAL_COMMUNITY): Admission: RE | Admit: 2020-06-29 | Payer: Medicaid Other | Source: Ambulatory Visit

## 2020-07-13 ENCOUNTER — Other Ambulatory Visit: Payer: Self-pay

## 2020-07-13 ENCOUNTER — Ambulatory Visit (HOSPITAL_COMMUNITY)
Admission: RE | Admit: 2020-07-13 | Discharge: 2020-07-13 | Disposition: A | Discharge status: Home / Self Care | Payer: Medicaid Other | Source: Ambulatory Visit | Attending: Oncology | Admitting: Oncology

## 2020-07-13 DIAGNOSIS — Z87891 Personal history of nicotine dependence: Secondary | ICD-10-CM | POA: Diagnosis not present

## 2020-07-13 DIAGNOSIS — Z122 Encounter for screening for malignant neoplasm of respiratory organs: Secondary | ICD-10-CM | POA: Diagnosis present

## 2020-07-14 ENCOUNTER — Other Ambulatory Visit: Payer: Self-pay | Admitting: Obstetrics & Gynecology

## 2020-07-14 ENCOUNTER — Telehealth: Payer: Self-pay | Admitting: Obstetrics & Gynecology

## 2020-07-14 MED ORDER — ACYCLOVIR 400 MG PO TABS: ORAL_TABLET | ORAL | 12 refills | Status: DC

## 2020-07-14 MED ORDER — ESTRADIOL 1 MG PO TABS: 1.0000 mg | ORAL_TABLET | Freq: Every day | ORAL | 11 refills | Status: DC

## 2020-07-14 NOTE — Telephone Encounter (Signed)
Patient called stating that she was told by Dr. Glo Herring that all she needed to do was call her pharmacy to get refills of her estradiol and we should refill it but her pharmacy states that we never sent it back. Please contact pt

## 2020-07-14 NOTE — Telephone Encounter (Signed)
Pt is needing a refill on Estradiol and Acyclovir. Please send to pharmacy. Thanks!! Peterson

## 2020-07-14 NOTE — Telephone Encounter (Signed)
done

## 2020-07-17 ENCOUNTER — Encounter (HOSPITAL_COMMUNITY): Payer: Self-pay

## 2020-07-17 NOTE — Progress Notes (Signed)
Patient notified of LDCT Lung Cancer Screening Results via mail with the recommendation to follow-up in 12 months. Patient's referring provider has been sent a copy of results. Results are as follows:   IMPRESSION: 1. Lung-RADS 2S, benign appearance or behavior. Continue annual screening with low-dose chest CT without contrast in 12 months. 2. The "S" modifier above refers to potentially clinically significant non lung cancer related findings. Specifically, there is aortic atherosclerosis, in addition to left main and 3 vessel coronary artery disease. Please note that although the presence of coronary artery calcium documents the presence of coronary artery disease, the severity of this disease and any potential stenosis cannot be assessed on this non-gated CT examination. Assessment for potential risk factor modification, dietary therapy or pharmacologic therapy may be warranted, if clinically indicated. 3. Mild diffuse bronchial wall thickening with moderate centrilobular and paraseptal emphysema; imaging findings suggestive of underlying COPD. 4. Mild hepatic steatosis.

## 2020-07-24 ENCOUNTER — Telehealth: Payer: Self-pay | Admitting: Cardiology

## 2020-07-24 NOTE — Telephone Encounter (Signed)
Request copy of abdominal u/s from 12/2017

## 2020-07-24 NOTE — Telephone Encounter (Signed)
Patient called stating that she needs to know the next step with Dr. Domenic Polite after taking her vascular testing.

## 2020-08-02 ENCOUNTER — Other Ambulatory Visit (INDEPENDENT_AMBULATORY_CARE_PROVIDER_SITE_OTHER): Payer: Medicaid Other | Admitting: *Deleted

## 2020-08-02 ENCOUNTER — Other Ambulatory Visit: Payer: Self-pay

## 2020-08-02 DIAGNOSIS — R350 Frequency of micturition: Secondary | ICD-10-CM

## 2020-08-02 DIAGNOSIS — R300 Dysuria: Secondary | ICD-10-CM | POA: Diagnosis not present

## 2020-08-02 LAB — POCT URINALYSIS DIPSTICK
Glucose, UA: NEGATIVE
Ketones, UA: NEGATIVE
Nitrite, UA: POSITIVE
Protein, UA: NEGATIVE

## 2020-08-02 MED ORDER — NITROFURANTOIN MONOHYD MACRO 100 MG PO CAPS: 100.0000 mg | ORAL_CAPSULE | Freq: Two times a day (BID) | ORAL | 0 refills | Status: DC

## 2020-08-02 NOTE — Progress Notes (Addendum)
   NURSE VISIT- UTI SYMPTOMS   SUBJECTIVE:  [name] is a 60 y.o. G62P4004 female here for UTI symptoms. She is a GYN patient. She reports  burning with urination and urinary frequency .  OBJECTIVE:  There were no vitals taken for this visit.  Appears well, in no apparent distress  Results for orders placed or performed in visit on 08/02/20 (from the past 24 hour(s))  POCT Urinalysis Dipstick   Collection Time: 08/02/20  2:09 PM  Result Value Ref Range   Color, UA     Clarity, UA     Glucose, UA Negative Negative   Bilirubin, UA     Ketones, UA neg    Spec Grav, UA     Blood, UA trace    pH, UA     Protein, UA Negative Negative   Urobilinogen, UA     Nitrite, UA positive    Leukocytes, UA Moderate (2+) (A) Negative   Appearance     Odor      ASSESSMENT: GYN patient with UTI symptoms and positive nitrites  PLAN: Discussed with Knute Neu, CNM, [facility]   Rx sent by provider today: Yes Urine culture sent Call or return to clinic prn if these symptoms worsen or fail to improve as anticipated. Follow-up: as needed   Levy Pupa  08/02/2020 2:15 PM  Chart reviewed for nurse visit. Agree with plan of care. Rx macrobid. Send cx.  Roma Schanz, [place] 08/02/2020 3:06 PM

## 2020-08-02 NOTE — Addendum Note (Signed)
Addended by: Roma Schanz on: 08/02/2020 03:07 PM   Modules accepted: Orders

## 2020-08-09 LAB — URINE CULTURE

## 2020-08-15 ENCOUNTER — Other Ambulatory Visit: Payer: Self-pay | Admitting: Women's Health

## 2020-10-05 ENCOUNTER — Telehealth: Payer: Self-pay | Admitting: Obstetrics & Gynecology

## 2020-10-05 ENCOUNTER — Other Ambulatory Visit: Payer: Self-pay | Admitting: Advanced Practice Midwife

## 2020-10-05 MED ORDER — NITROFURANTOIN MONOHYD MACRO 100 MG PO CAPS: 100.0000 mg | ORAL_CAPSULE | Freq: Two times a day (BID) | ORAL | 0 refills | Status: DC

## 2020-10-05 NOTE — Telephone Encounter (Signed)
Called pt to relay new prescription sent in to pharmacy. Pt confirmed understanding.

## 2020-10-05 NOTE — Telephone Encounter (Signed)
Pt's UTI has returned, recently treated, went away & is now back No pain, just odor to urine, just like last time  Can we order more antibiotics?   [name]  Please advise & notify pt

## 2020-10-09 ENCOUNTER — Other Ambulatory Visit: Payer: Self-pay

## 2020-10-09 ENCOUNTER — Telehealth: Payer: Self-pay

## 2020-10-09 ENCOUNTER — Other Ambulatory Visit (INDEPENDENT_AMBULATORY_CARE_PROVIDER_SITE_OTHER): Payer: Medicaid Other

## 2020-10-09 DIAGNOSIS — Z8619 Personal history of other infectious and parasitic diseases: Secondary | ICD-10-CM

## 2020-10-09 DIAGNOSIS — R399 Unspecified symptoms and signs involving the genitourinary system: Secondary | ICD-10-CM

## 2020-10-09 HISTORY — DX: Personal history of other infectious and parasitic diseases: Z86.19

## 2020-10-09 LAB — POCT URINALYSIS DIPSTICK OB
Blood, UA: NEGATIVE
Glucose, UA: NEGATIVE
Ketones, UA: NEGATIVE
Leukocytes, UA: NEGATIVE
Nitrite, UA: NEGATIVE
POC,PROTEIN,UA: NEGATIVE

## 2020-10-09 NOTE — Progress Notes (Addendum)
  NURSE VISIT- UTI SYMPTOMS   SUBJECTIVE:  [name] is a 60 y.o. G57P4004 female here for UTI symptoms. She is a GYN patient. Pt called in due to allergic reaction to Macrobid. Macrobid placed on allergy list. During phone call, she reports low grade fever   and odor . Pt spoke with Derrek Monaco, NP and was asked to bring in a urine sample. Husband brought in urine sample to be tested.  OBJECTIVE:  There were no vitals taken for this visit.  Appears well, in no apparent distress  Results for orders placed or performed in visit on 10/09/20 (from the past 24 hour(s))  POC Urinalysis Dipstick OB   Collection Time: 10/09/20 10:57 AM  Result Value Ref Range   Color, UA     Clarity, UA     Glucose, UA Negative Negative   Bilirubin, UA     Ketones, UA neg    Spec Grav, UA     Blood, UA neg    pH, UA     POC,PROTEIN,UA Negative Negative, Trace, Small (1+), Moderate (2+), Large (3+), 4+   Urobilinogen, UA     Nitrite, UA neg    Leukocytes, UA Negative Negative   Appearance     Odor      ASSESSMENT: GYN patient with UTI symptoms and negative nitrites  PLAN: Note routed to Derrek Monaco, AGNP   Rx sent by provider today: No Urine culture sent Call or return to clinic prn if these symptoms worsen or fail to improve as anticipated. Follow-up: as needed   Jonanthan Bolender A Saga Balthazar  10/09/2020 10:58 AM

## 2020-10-09 NOTE — Telephone Encounter (Signed)
Called [name], she has stopped Macrobid, and I ask her to give a urine specimen for a [place] C&S, before giving another antibiotic. Her husband will bring it by. I placed history of hepatitis C on problem list, as it was not there.

## 2020-10-09 NOTE — Progress Notes (Signed)
Chart reviewed for nurse visit. Agree with plan of care.  Estill Dooms, NP 10/09/2020 3:20 PM

## 2020-10-09 NOTE — Telephone Encounter (Signed)
Pt called stating that after taking the Macrobid, she broke out in a rash from head to toe and had a fever. She continued taking the med for approx 5 doses, starting 8/25. She had Hep C in the past, cured, and was told by a family member that she shouldn't be taking Macrobid. She had taken it in June with no issues.  Macrobid placed in allergy list. Pt instructed to use Benadryl for rash. Sending to provider for new prescription. Urine still has odor.

## 2020-10-10 LAB — URINALYSIS, ROUTINE W REFLEX MICROSCOPIC
Bilirubin, UA: POSITIVE — AB
Glucose, UA: NEGATIVE
Ketones, UA: NEGATIVE
Leukocytes,UA: NEGATIVE
Nitrite, UA: NEGATIVE
RBC, UA: NEGATIVE
Specific Gravity, UA: 1.014 (ref 1.005–1.030)
Urobilinogen, Ur: 1 mg/dL (ref 0.2–1.0)
pH, UA: 7.5 (ref 5.0–7.5)

## 2020-10-11 LAB — URINE CULTURE: Organism ID, Bacteria: NO GROWTH

## 2020-11-30 ENCOUNTER — Encounter: Payer: Self-pay | Admitting: Internal Medicine

## 2021-04-20 ENCOUNTER — Ambulatory Visit: Payer: Medicaid Other | Admitting: Gastroenterology

## 2021-04-20 ENCOUNTER — Encounter: Payer: Self-pay | Admitting: Gastroenterology

## 2021-05-04 ENCOUNTER — Telehealth: Payer: Self-pay

## 2021-05-04 NOTE — Telephone Encounter (Signed)
PT CALLED AND STATED THAT SHE BELIEVES SHE IS GETTING A UTI AND WANTS MEDICATIONS CALLED IN.  ?

## 2021-06-17 NOTE — Progress Notes (Deleted)
GI Office Note    Referring Provider: Sofie Rower, PA-C Primary Care Physician:  Sofie Rower, PA-C  Primary Gastroenterologist:  Chief Complaint   No chief complaint on file.    History of Present Illness   [name] is a 61 y.o. female presenting today at the request of O'Laf massenburg PA-C for consideration of colonoscopy.        Medications   Current Outpatient Medications  Medication Sig Dispense Refill   acyclovir (ZOVIRAX) 400 MG tablet TAKE (1) TABLET TWICE DAILY. 60 tablet 12   ALBUTEROL SULFATE HFA IN Inhale into the lungs as needed.      ALPRAZolam (XANAX) 1 MG tablet Take 1 mg by mouth as needed for anxiety.     aspirin EC 81 MG tablet Take 81 mg by mouth daily.     buprenorphine-naloxone (SUBOXONE) 2-0.5 MG SUBL SL tablet Place 0.5 tablets under the tongue daily.      cyclobenzaprine (FLEXERIL) 10 MG tablet Take 10 mg by mouth 3 (three) times daily as needed for muscle spasms.     desvenlafaxine (PRISTIQ) 50 MG 24 hr tablet Take by mouth.     estradiol (ESTRACE) 1 MG tablet Take 1 tablet (1 mg total) by mouth daily. 30 tablet 11   ibuprofen (ADVIL) 200 MG tablet Take by mouth.     lovastatin (MEVACOR) 10 MG tablet Take 10 mg by mouth at bedtime.     mometasone-formoterol (DULERA) 200-5 MCG/ACT AERO Inhale 2 puffs into the lungs 2 (two) times daily.     Multiple Vitamins-Minerals (ONE-A-DAY WOMENS 50+ ADVANTAGE PO) Take by mouth daily.     nitrofurantoin, macrocrystal-monohydrate, (MACROBID) 100 MG capsule Take 1 capsule (100 mg total) by mouth 2 (two) times daily. X 7 days 14 capsule 0   phentermine (ADIPEX-P) 37.5 MG tablet TAKE 1 TABLET BY MOUTH EVERY MORNING 2 HOURS BEFORE BREAKFAST WITH 8 OZ OF WATER: 5LB WEIGHT LOSS PER MONTH. 30 tablet 0   No current facility-administered medications for this visit.    Allergies   Allergies as of 06/18/2021 - Review Complete 08/02/2020  Allergen Reaction Noted   Macrobid [nitrofurantoin]  Rash and Other (See Comments) 10/09/2020   Other Other (See Comments) 12/04/2010    Past Medical History   Past Medical History:  Diagnosis Date   Anxiety    COPD (chronic obstructive pulmonary disease) ([place])    Depression    Fibromyalgia    Hepatitis C    History of hepatitis C 10/09/2020   History of herpes simplex type 2 infection    History of pancreatitis    History of pelvic fracture    Hyperlipidemia    Osteoporosis     Past Surgical History   Past Surgical History:  Procedure Laterality Date   ABDOMINAL HYSTERECTOMY     BILATERAL SALPINGOOPHORECTOMY     MANDIBLE SURGERY      Past Family History   Family History  Problem Relation Age of Onset   Cancer Mother    Heart disease Father    Heart attack Father     Past Social History   Social History   Socioeconomic History   Marital status: Married    Spouse name: Not on file   Number of children: Not on file   Years of education: Not on file   Highest education level: Not on file  Occupational History   Not on file  Tobacco Use   Smoking status: Former    Years: 33.00  Types: Cigarettes    Quit date: 11/11/2012    Years since quitting: 8.6   Smokeless tobacco: Never  Vaping Use   Vaping Use: Never used  Substance and Sexual Activity   Alcohol use: No   Drug use: No   Sexual activity: Not Currently    Birth control/protection: Surgical    Comment: hyst  Other Topics Concern   Not on file  Social History Narrative   Not on file   Social Determinants of Health   Financial Resource Strain: Not on file  Food Insecurity: Not on file  Transportation Needs: Not on file  Physical Activity: Not on file  Stress: Not on file  Social Connections: Not on file  Intimate Partner Violence: Not on file    Review of Systems   General: Negative for anorexia, weight loss, fever, chills, fatigue, weakness. Eyes: Negative for vision changes.  ENT: Negative for hoarseness, difficulty swallowing , nasal  congestion. CV: Negative for chest pain, angina, palpitations, dyspnea on exertion, peripheral edema.  Respiratory: Negative for dyspnea at rest, dyspnea on exertion, cough, sputum, wheezing.  GI: See history of present illness. GU:  Negative for dysuria, hematuria, urinary incontinence, urinary frequency, nocturnal urination.  MS: Negative for joint pain, low back pain.  Derm: Negative for rash or itching.  Neuro: Negative for weakness, abnormal sensation, seizure, frequent headaches, memory loss,  confusion.  Psych: Negative for anxiety, depression, suicidal ideation, hallucinations.  Endo: Negative for unusual weight change.  Heme: Negative for bruising or bleeding. Allergy: Negative for rash or hives.  Physical Exam   There were no vitals taken for this visit.   General: Well-nourished, well-developed in no acute distress.  Head: Normocephalic, atraumatic.   Eyes: Conjunctiva pink, no icterus. Mouth: Oropharyngeal mucosa moist and pink , no lesions erythema or exudate. Neck: Supple without thyromegaly, masses, or lymphadenopathy.  Lungs: Clear to auscultation bilaterally.  Heart: Regular rate and rhythm, no murmurs rubs or gallops.  Abdomen: Bowel sounds are normal, nontender, nondistended, no hepatosplenomegaly or masses,  no abdominal bruits or hernia, no rebound or guarding.   Rectal: *** Extremities: No lower extremity edema. No clubbing or deformities.  Neuro: Alert and oriented x 4 , grossly normal neurologically.  Skin: Warm and dry, no rash or jaundice.   Psych: Alert and cooperative, normal mood and affect.  Labs   *** Imaging Studies   No results found.  Assessment       PLAN   ***   Laureen Ochs. Bobby Rumpf, [place], [facility]

## 2021-06-18 ENCOUNTER — Ambulatory Visit: Payer: Medicaid Other | Admitting: Gastroenterology

## 2021-06-20 ENCOUNTER — Telehealth: Payer: Self-pay

## 2021-06-20 ENCOUNTER — Other Ambulatory Visit: Payer: Self-pay

## 2021-06-20 ENCOUNTER — Ambulatory Visit: Payer: Medicaid Other | Admitting: Internal Medicine

## 2021-06-20 ENCOUNTER — Encounter: Payer: Self-pay | Admitting: Internal Medicine

## 2021-06-20 VITALS — BP 114/72 | HR 58 | Temp 97.3°F | Ht 62.0 in | Wt 195.6 lb

## 2021-06-20 DIAGNOSIS — Z8619 Personal history of other infectious and parasitic diseases: Secondary | ICD-10-CM | POA: Diagnosis not present

## 2021-06-20 DIAGNOSIS — Z79899 Other long term (current) drug therapy: Secondary | ICD-10-CM

## 2021-06-20 DIAGNOSIS — Z1211 Encounter for screening for malignant neoplasm of colon: Secondary | ICD-10-CM

## 2021-06-20 MED ORDER — CLENPIQ 10-3.5-12 MG-GM -GM/175ML PO SOLN: 1.0000 | ORAL | 0 refills | Status: DC

## 2021-06-20 NOTE — Telephone Encounter (Signed)
Called pt, TCS ASA 3 w/Dr. Abbey Chatters scheduled for 07/16/21 at 2:30pm. Advised pt to hold Phentermine for 7 days prior to procedure. Rx for prep sent to pharmacy. Orders entered.  ? ?Pre-op appt 07/13/21. Appt letter mailed with procedure instructions. ?

## 2021-06-20 NOTE — Progress Notes (Signed)
? ? ?Primary Care Physician:  Sofie Rower, PA-C ?Primary Gastroenterologist:  Dr. Abbey Chatters ? ?Chief Complaint  ?Patient presents with  ? New Patient (Initial Visit)  ?  colonoscopy  ? ? ?HPI:   ?[name] is a 61 y.o. female who presents to clinic today by referral from her PCP [place].  Patient states she is due for colonoscopy.  Last colonoscopy 1011 years ago.  States she had a few small polyps removed.  Unsure which type.  Notes family history of colon cancer in her uncle.  No melena hematochezia.  No abdominal pain.  No unintentional weight loss.  Does note losing approximately 25 pounds intentionally.  Chronically on phentermine.  Has also altered her diet. ? ?Denies any upper GI symptoms including heartburn, reflux, epigastric or chest pain, dysphagia/odynophagia. ? ?Notes she was diagnosed with hepatitis C in the early 2000's.  Contracted this from her sister by helping her change wounds from a spider bite.  States she was told that she cleared the infection on her own.  Treatment na?ve. ? ?Past Medical History:  ?Diagnosis Date  ? Anxiety   ? COPD (chronic obstructive pulmonary disease) ([place])   ? Depression   ? Fibromyalgia   ? Hepatitis C   ? History of hepatitis C 10/09/2020  ? History of herpes simplex type 2 infection   ? History of pancreatitis   ? History of pelvic fracture   ? Hyperlipidemia   ? Osteoporosis   ? ? ?Past Surgical History:  ?Procedure Laterality Date  ? ABDOMINAL HYSTERECTOMY    ? BILATERAL SALPINGOOPHORECTOMY    ? MANDIBLE SURGERY    ? ? ?Current Outpatient Medications  ?Medication Sig Dispense Refill  ? acyclovir (ZOVIRAX) 400 MG tablet TAKE (1) TABLET TWICE DAILY. 60 tablet 12  ? ALBUTEROL SULFATE HFA IN Inhale into the lungs as needed.     ? ALPRAZolam (XANAX) 1 MG tablet Take 1 mg by mouth as needed for anxiety.    ? aspirin EC 81 MG tablet Take 81 mg by mouth daily.    ? buprenorphine-naloxone (SUBOXONE) 2-0.5 MG SUBL SL tablet Place 0.5 tablets under the  tongue daily.     ? cyclobenzaprine (FLEXERIL) 10 MG tablet Take 10 mg by mouth 3 (three) times daily as needed for muscle spasms.    ? estradiol (ESTRACE) 1 MG tablet Take 1 tablet (1 mg total) by mouth daily. 30 tablet 11  ? ibuprofen (ADVIL) 200 MG tablet Take by mouth.    ? lovastatin (MEVACOR) 10 MG tablet Take 10 mg by mouth at bedtime.    ? mometasone-formoterol (DULERA) 200-5 MCG/ACT AERO Inhale 2 puffs into the lungs 2 (two) times daily.    ? Multiple Vitamins-Minerals (ONE-A-DAY WOMENS 50+ ADVANTAGE PO) Take by mouth daily.    ? phentermine (ADIPEX-P) 37.5 MG tablet TAKE 1 TABLET BY MOUTH EVERY MORNING 2 HOURS BEFORE BREAKFAST WITH 8 OZ OF WATER: 5LB WEIGHT LOSS PER MONTH. 30 tablet 0  ? sertraline (ZOLOFT) 25 MG tablet Take 25 mg by mouth daily.    ? desvenlafaxine (PRISTIQ) 50 MG 24 hr tablet Take by mouth. (Patient not taking: Reported on 06/20/2021)    ? nitrofurantoin, macrocrystal-monohydrate, (MACROBID) 100 MG capsule Take 1 capsule (100 mg total) by mouth 2 (two) times daily. X 7 days (Patient not taking: Reported on 06/20/2021) 14 capsule 0  ? ?No current facility-administered medications for this visit.  ? ? ?Allergies as of 06/20/2021 - Review Complete 06/20/2021  ?Allergen Reaction  Noted  ? Macrobid [nitrofurantoin] Rash and Other (See Comments) 10/09/2020  ? Other Other (See Comments) 12/04/2010  ? ? ?Family History  ?Problem Relation Age of Onset  ? Cancer Mother   ? Heart disease Father   ? Heart attack Father   ? ? ?Social History  ? ?Socioeconomic History  ? Marital status: Married  ?  Spouse name: Not on file  ? Number of children: Not on file  ? Years of education: Not on file  ? Highest education level: Not on file  ?Occupational History  ? Not on file  ?Tobacco Use  ? Smoking status: Former  ?  Years: 33.00  ?  Types: Cigarettes  ?  Quit date: 11/11/2012  ?  Years since quitting: 8.6  ? Smokeless tobacco: Never  ?Vaping Use  ? Vaping Use: Never used  ?Substance and Sexual Activity  ?  Alcohol use: No  ? Drug use: No  ? Sexual activity: Not Currently  ?  Birth control/protection: Surgical  ?  Comment: hyst  ?Other Topics Concern  ? Not on file  ?Social History Narrative  ? Not on file  ? ?Social Determinants of Health  ? ?Financial Resource Strain: Not on file  ?Food Insecurity: Not on file  ?Transportation Needs: Not on file  ?Physical Activity: Not on file  ?Stress: Not on file  ?Social Connections: Not on file  ?Intimate Partner Violence: Not on file  ? ? ?Subjective: ?Review of Systems  ?Constitutional:  Negative for chills and fever.  ?HENT:  Negative for congestion and hearing loss.   ?Eyes:  Negative for blurred vision and double vision.  ?Respiratory:  Negative for cough and shortness of breath.   ?Cardiovascular:  Negative for chest pain and palpitations.  ?Gastrointestinal:  Negative for abdominal pain, blood in stool, constipation, diarrhea, heartburn, melena and vomiting.  ?Genitourinary:  Negative for dysuria and urgency.  ?Musculoskeletal:  Negative for joint pain and myalgias.  ?Skin:  Negative for itching and rash.  ?Neurological:  Negative for dizziness and headaches.  ?Psychiatric/Behavioral:  Negative for depression. The patient is not nervous/anxious.    ? ? ? ?Objective: ?BP 114/72   Pulse (!) 58   Temp (!) 97.3 ?F (36.3 ?C)   Ht '5\' 2"'$  (1.575 m)   Wt 195 lb 9.6 oz (88.7 kg) Comment: pt's weight  BMI 35.78 kg/m?  ?Physical Exam ?Constitutional:   ?   Appearance: Normal appearance.  ?HENT:  ?   Head: Normocephalic and atraumatic.  ?Eyes:  ?   Extraocular Movements: Extraocular movements intact.  ?   Conjunctiva/sclera: Conjunctivae normal.  ?Cardiovascular:  ?   Rate and Rhythm: Normal rate and regular rhythm.  ?Pulmonary:  ?   Effort: Pulmonary effort is normal.  ?   Breath sounds: Normal breath sounds.  ?Abdominal:  ?   General: Bowel sounds are normal.  ?   Palpations: Abdomen is soft.  ?Musculoskeletal:     ?   General: No swelling. Normal range of motion.  ?   Cervical  back: Normal range of motion and neck supple.  ?Skin: ?   General: Skin is warm and dry.  ?   Coloration: Skin is not jaundiced.  ?Neurological:  ?   General: No focal deficit present.  ?   Mental Status: She is alert and oriented to person, place, and time.  ?Psychiatric:     ?   Mood and Affect: Mood normal.     ?   Behavior: Behavior normal.  ? ? ? ?  Assessment: ?*Colon cancer screening ?*History of hepatitis C ? ?Plan: ?Will schedule for screening colonoscopy.The risks including infection, bleed, or perforation as well as benefits, limitations, alternatives and imponderables have been reviewed with the patient. Questions have been answered. All parties agreeable. ? ?ASA 3 given history of COPD. ? ?Patient will need to hold phentermine x7 days prior to procedure. ? ?We will check hepatitis C antibody, viral load, and LFTs today given her history to ensure eradication.  She is treatment na?ve. ? ?Thank you O'Laf Massenburg for the kind referral. ? ?06/20/2021 10:15 AM ? ? ?Disclaimer: This note was dictated with voice recognition software. Similar sounding words can inadvertently be transcribed and may not be corrected upon review. ? ?

## 2021-06-20 NOTE — Patient Instructions (Signed)
We will schedule you for colonoscopy for colon cancer screening purposes. ? ?I am going to order blood work at [facility] labs to be done to check for hepatitis C. ? ?You will need to hold your phentermine x7 days prior to colonoscopy. ? ?It was very nice meeting you today. ? ?Dr. Abbey Chatters ? ?At [facility] we value your feedback. You may receive a survey about your visit today. Please share your experience as we strive to create trusting relationships with our patients to provide genuine, compassionate, quality care. ? ?We appreciate your understanding and patience as we review any laboratory studies, imaging, and other diagnostic tests that are ordered as we care for you. Our office policy is 5 business days for review of these results, and any emergent or urgent results are addressed in a timely manner for your best interest. If you do not hear from our office in 1 week, please contact us.  ? ?We also encourage the use of MyChart, which contains your medical information for your review as well. If you are not enrolled in this feature, an access code is on this after visit summary for your convenience. Thank you for allowing [place] to be involved in your care. ? ?It was great to see you today!  I hope you have a great rest of your Spring! ? ? ? ?[name]. Abbey Chatters, D.O. ?Gastroenterology and Hepatology ?[facility] ? ?

## 2021-06-21 ENCOUNTER — Telehealth: Payer: Self-pay | Admitting: Internal Medicine

## 2021-06-21 NOTE — Telephone Encounter (Signed)
Phoned and advised the pt once the results come to me I will call her. Pt expressed understanding ?

## 2021-06-21 NOTE — Telephone Encounter (Signed)
Patient received her lab results through mychart but does not understand them.  Please call her  ?

## 2021-06-24 LAB — HEPATITIS C RNA QUANTITATIVE
HCV Quantitative Log: <1.18 Log IU/mL
HCV Quantitative Log: <1.18 log IU/mL
HCV RNA, PCR, QN: <15 IU/mL
HCV RNA, PCR, QN: <15 IU/mL

## 2021-06-24 LAB — HEPATIC FUNCTION PANEL
AG Ratio: 1.5 (calc) (ref 1.0–2.5)
ALT: 29 U/L (ref 6–29)
AST: 24 U/L (ref 10–35)
Albumin: 4.2 g/dL (ref 3.6–5.1)
Alkaline phosphatase (APISO): 80 U/L (ref 37–153)
Bilirubin, Direct: 0.1 mg/dL (ref 0.0–0.2)
Globulin: 2.8 g/dL (calc) (ref 1.9–3.7)
Indirect Bilirubin: 0.4 mg/dL (calc) (ref 0.2–1.2)
Total Bilirubin: 0.5 mg/dL (ref 0.2–1.2)
Total Protein: 7 g/dL (ref 6.1–8.1)

## 2021-06-24 LAB — HEPATITIS C ANTIBODY
Hepatitis C Ab: REACTIVE — AB
SIGNAL TO CUT-OFF: 3.93 — ABNORMAL HIGH (ref <1.00)

## 2021-07-11 ENCOUNTER — Other Ambulatory Visit: Payer: Self-pay | Admitting: Obstetrics & Gynecology

## 2021-07-11 NOTE — Patient Instructions (Signed)
Alyson L Molstad  07/11/2021     '@[email or web address]'$ @   Your procedure is scheduled on  07/16/2021.   Report to [facility] at  1230 P.M.   Call this number if you have problems the morning of surgery:  [phone]   Remember:  Follow the diet and prep instructions given to you by the office.    Use your inhalers before you come and bring your rescue inhaler with you.    Take these medicines the morning of surgery with A SIP OF WATER           xanax(if needed), amlodipine, suboxone, flexeril or mobic(if needed).     Do not wear jewelry, make-up or nail polish.  Do not wear lotions, powders, or perfumes, or deodorant.  Do not shave 48 hours prior to surgery.  Men may shave face and neck.  Do not bring valuables to the hospital.  [facility] is not responsible for any belongings or valuables.  Contacts, dentures or bridgework may not be worn into surgery.  Leave your suitcase in the car.  After surgery it may be brought to your room.  For patients admitted to the hospital, discharge time will be determined by your treatment team.  Patients discharged the day of surgery will not be allowed to drive home and must have someone with them for 24 hours.    Special instructions:   DO NOT smoke tobacco or vape for 24 hours before your procedure.  Please read over the following fact sheets that you were given. Anesthesia Post-op Instructions and Care and Recovery After Surgery      Colonoscopy, Adult, Care After The following information offers guidance on how to care for yourself after your procedure. Your health care provider may also give you more specific instructions. If you have problems or questions, contact your health care provider. What can I expect after the procedure? After the procedure, it is common to have: A small amount of blood in your stool for 24 hours after the procedure. Some gas. Mild cramping or bloating of your abdomen. Follow these instructions  at home: Eating and drinking  Drink enough fluid to keep your urine pale yellow. Follow instructions from your health care provider about eating or drinking restrictions. Resume your normal diet as told by your health care provider. Avoid heavy or fried foods that are hard to digest. Activity Rest as told by your health care provider. Avoid sitting for a long time without moving. Get up to take short walks every 1-2 hours. This is important to improve blood flow and breathing. Ask for help if you feel weak or unsteady. Return to your normal activities as told by your health care provider. Ask your health care provider what activities are safe for you. Managing cramping and bloating  Try walking around when you have cramps or feel bloated. If directed, apply heat to your abdomen as told by your health care provider. Use the heat source that your health care provider recommends, such as a moist heat pack or a heating pad. Place a towel between your skin and the heat source. Leave the heat on for 20-30 minutes. Remove the heat if your skin turns bright red. This is especially important if you are unable to feel pain, heat, or cold. You have a greater risk of getting burned. General instructions If you were given a sedative during the procedure, it can affect you for several hours. Do not drive or  operate machinery until your health care provider says that it is safe. For the first 24 hours after the procedure: Do not sign important documents. Do not drink alcohol. Do your regular daily activities at a slower pace than normal. Eat soft foods that are easy to digest. Take over-the-counter and prescription medicines only as told by your health care provider. Keep all follow-up visits. This is important. Contact a health care provider if: You have blood in your stool 2-3 days after the procedure. Get help right away if: You have more than a small spotting of blood in your stool. You have large  blood clots in your stool. You have swelling of your abdomen. You have nausea or vomiting. You have a fever. You have increasing pain in your abdomen that is not relieved with medicine. These symptoms may be an emergency. Get help right away. Call 911. Do not wait to see if the symptoms will go away. Do not drive yourself to the hospital. Summary After the procedure, it is common to have a small amount of blood in your stool. You may also have mild cramping and bloating of your abdomen. If you were given a sedative during the procedure, it can affect you for several hours. Do not drive or operate machinery until your health care provider says that it is safe. Get help right away if you have a lot of blood in your stool, nausea or vomiting, a fever, or increased pain in your abdomen. This information is not intended to replace advice given to you by your health care provider. Make sure you discuss any questions you have with your health care provider. Document Revised: 09/20/2020 Document Reviewed: 09/20/2020 Elsevier Patient Education  [place] After This sheet gives you information about how to care for yourself after your procedure. Your health care provider may also give you more specific instructions. If you have problems or questions, contact your health care provider. What can I expect after the procedure? After the procedure, it is common to have: Tiredness. Forgetfulness about what happened after the procedure. Impaired judgment for important decisions. Nausea or vomiting. Some difficulty with balance. Follow these instructions at home: For the time period you were told by your health care provider:     Rest as needed. Do not participate in activities where you could fall or become injured. Do not drive or use machinery. Do not drink alcohol. Do not take sleeping pills or medicines that cause drowsiness. Do not make important  decisions or sign legal documents. Do not take care of children on your own. Eating and drinking Follow the diet that is recommended by your health care provider. Drink enough fluid to keep your urine pale yellow. If you vomit: Drink water, juice, or soup when you can drink without vomiting. Make sure you have little or no nausea before eating solid foods. General instructions Have a responsible adult stay with you for the time you are told. It is important to have someone help care for you until you are awake and alert. Take over-the-counter and prescription medicines only as told by your health care provider. If you have sleep apnea, surgery and certain medicines can increase your risk for breathing problems. Follow instructions from your health care provider about wearing your sleep device: Anytime you are sleeping, including during daytime naps. While taking prescription pain medicines, sleeping medicines, or medicines that make you drowsy. Avoid smoking. Keep all follow-up visits as told by your health  care provider. This is important. Contact a health care provider if: You keep feeling nauseous or you keep vomiting. You feel light-headed. You are still sleepy or having trouble with balance after 24 hours. You develop a rash. You have a fever. You have redness or swelling around the IV site. Get help right away if: You have trouble breathing. You have new-onset confusion at home. Summary For several hours after your procedure, you may feel tired. You may also be forgetful and have poor judgment. Have a responsible adult stay with you for the time you are told. It is important to have someone help care for you until you are awake and alert. Rest as told. Do not drive or operate machinery. Do not drink alcohol or take sleeping pills. Get help right away if you have trouble breathing, or if you suddenly become confused. This information is not intended to replace advice given to you  by your health care provider. Make sure you discuss any questions you have with your health care provider. Document Revised: 01/02/2021 Document Reviewed: 12/31/2018 Elsevier Patient Education  [place].

## 2021-07-12 ENCOUNTER — Telehealth: Payer: Self-pay | Admitting: Obstetrics & Gynecology

## 2021-07-12 ENCOUNTER — Other Ambulatory Visit: Payer: Self-pay

## 2021-07-12 MED ORDER — ESTRADIOL 1 MG PO TABS: 1.0000 mg | ORAL_TABLET | Freq: Every day | ORAL | 11 refills | Status: DC

## 2021-07-12 NOTE — Telephone Encounter (Signed)
Patient called stating that she is out of her estradiol, she does have a appointment for 06/19 but is asking for script to last until then.

## 2021-07-12 NOTE — Telephone Encounter (Signed)
Refill request sent to [facility]

## 2021-07-13 ENCOUNTER — Telehealth: Payer: Self-pay

## 2021-07-13 ENCOUNTER — Encounter (HOSPITAL_COMMUNITY)
Admission: RE | Admit: 2021-07-13 | Discharge: 2021-07-13 | Disposition: A | Discharge status: Home / Self Care | Payer: Medicaid Other | Source: Ambulatory Visit | Attending: Internal Medicine | Admitting: Internal Medicine

## 2021-07-13 DIAGNOSIS — Z79899 Other long term (current) drug therapy: Secondary | ICD-10-CM

## 2021-07-13 DIAGNOSIS — Z87891 Personal history of nicotine dependence: Secondary | ICD-10-CM

## 2021-07-13 NOTE — Telephone Encounter (Signed)
Pre-op appt 08/10/21. Appt letter mailed with procedure instructions.

## 2021-07-13 NOTE — Telephone Encounter (Signed)
Pt called office to reschedule TCS that is for 23-Jul-2021 due to death in family. TCS rescheduled to 08/13/21 at 12:45pm. Endo scheduler informed.

## 2021-07-30 ENCOUNTER — Encounter: Payer: Self-pay | Admitting: Adult Health

## 2021-07-30 ENCOUNTER — Ambulatory Visit: Payer: Medicaid Other | Admitting: Adult Health

## 2021-07-30 VITALS — BP 133/87 | HR 84 | Ht 63.0 in | Wt 185.0 lb

## 2021-07-30 DIAGNOSIS — B009 Herpesviral infection, unspecified: Secondary | ICD-10-CM | POA: Diagnosis not present

## 2021-07-30 DIAGNOSIS — Z79899 Other long term (current) drug therapy: Secondary | ICD-10-CM

## 2021-07-30 MED ORDER — ACYCLOVIR 400 MG PO TABS: ORAL_TABLET | ORAL | 0 refills | Status: DC

## 2021-07-30 NOTE — Progress Notes (Signed)
  Subjective:     Patient ID: [name], female   DOB: 13-Feb-1960, 61 y.o.   MRN: [id number]  HPI [name] is a 61 year old white female, married, sp hysterectomy in requesting refills on estrace, which Dr Elonda Husky refilled and acyclovir, uses prn, and wants phentermine, to help in losing weight, she used to get from Dr Glo Herring. PCP is [facility]  Review of Systems Wants refills  Reviewed past medical,surgical, social and family history. Reviewed medications and allergies.      Objective:   Physical Exam BP 133/87 (BP Location: Right Arm, Patient Position: Sitting, Cuff Size: Normal)   Pulse 84   Ht '5\' 3"'$  (1.6 m)   Wt 185 lb (83.9 kg)   BMI 32.77 kg/m     Skin warm and dry. Neck: mid line trachea, normal thyroid, good ROM, no lymphadenopathy noted. Lungs: clear to ausculation bilaterally. Cardiovascular: regular rate and rhythm. 1  Assessment:     1. Herpes simplex type II infection Will refill acyclovir, takes prn outbreak  Meds ordered this encounter  Medications   acyclovir (ZOVIRAX) 400 MG tablet    Sig: Take 1 twice daily for outbreak    Dispense:  60 tablet    Refill:  0    Order Specific Question:   Supervising Provider    Answer:   Elonda Husky, LUTHER H [[id number]]    2. Current use of estrogen therapy Taking estrace 1 mg daily has refills from Dr Elonda Husky  Will not prescribe phentermine at this time, on 2 BP meds and suboxone and xanax.      Plan:    Getting mammogram soon Colonoscopy in July  Follow up in 1 year

## 2021-08-10 ENCOUNTER — Encounter (HOSPITAL_COMMUNITY): Payer: Self-pay

## 2021-08-10 ENCOUNTER — Encounter (HOSPITAL_COMMUNITY)
Admission: RE | Admit: 2021-08-10 | Discharge: 2021-08-10 | Disposition: A | Discharge status: Home / Self Care | Payer: Medicaid Other | Source: Ambulatory Visit | Attending: Internal Medicine | Admitting: Internal Medicine

## 2021-08-10 DIAGNOSIS — Z87891 Personal history of nicotine dependence: Secondary | ICD-10-CM | POA: Insufficient documentation

## 2021-08-10 DIAGNOSIS — Z79899 Other long term (current) drug therapy: Secondary | ICD-10-CM | POA: Insufficient documentation

## 2021-08-10 DIAGNOSIS — Z01818 Encounter for other preprocedural examination: Secondary | ICD-10-CM | POA: Insufficient documentation

## 2021-08-10 LAB — BASIC METABOLIC PANEL
Anion gap: 11 (ref 5–15)
BUN: 13 mg/dL (ref 8–23)
CO2: 30 mmol/L (ref 22–32)
Calcium: 9.1 mg/dL (ref 8.9–10.3)
Chloride: 96 mmol/L — ABNORMAL LOW (ref 98–111)
Creatinine, Ser: 0.62 mg/dL (ref 0.44–1.00)
GFR, Estimated: >60 mL/min (ref >60)
Glucose, Bld: 96 mg/dL (ref 70–99)
Potassium: 2.8 mmol/L — ABNORMAL LOW (ref 3.5–5.1)
Sodium: 137 mmol/L (ref 135–145)

## 2021-08-12 ENCOUNTER — Telehealth: Payer: Self-pay | Admitting: Internal Medicine

## 2021-08-12 ENCOUNTER — Encounter (HOSPITAL_COMMUNITY): Payer: Self-pay | Admitting: Anesthesiology

## 2021-08-12 NOTE — Telephone Encounter (Signed)
Called patient about her low potassium.  Chronically takes chlorthalidone for hypertension and edema.  She states she needs to reschedule her procedure as she does not have the prep at her pharmacy.  She states Clenpiq was sent to her pharmacy in May though her insurance does not cover this.  She states she tried to get a hold of our office without success.  Can we reschedule her for her procedure?  In regards to her hypokalemia, I recommended that she call her PCP office tomorrow to discuss further and she states she will.  Thank you.

## 2021-08-15 MED ORDER — PEG 3350-KCL-NA BICARB-NACL 420 G PO SOLR: ORAL | 0 refills | Status: DC

## 2021-08-15 NOTE — Telephone Encounter (Signed)
Spoke with pt. Advised prep rx was sent in and I don't see anything about the pharmacy trying to contact us/ procedure rescheduled to 7/31 at 2:15pm. Aware will mail new pre-op appt and instructions. New rx sent in.

## 2021-08-15 NOTE — Addendum Note (Signed)
Addended by: Cheron Every on: 08/15/2021 03:49 PM   Modules accepted: Orders

## 2021-08-16 ENCOUNTER — Encounter: Payer: Self-pay | Admitting: *Deleted

## 2021-09-04 ENCOUNTER — Encounter (HOSPITAL_COMMUNITY): Payer: Self-pay

## 2021-09-05 ENCOUNTER — Encounter (HOSPITAL_COMMUNITY)
Admission: RE | Admit: 2021-09-05 | Discharge: 2021-09-05 | Disposition: A | Discharge status: Home / Self Care | Payer: Medicaid Other | Source: Ambulatory Visit | Attending: Internal Medicine | Admitting: Internal Medicine

## 2021-09-05 DIAGNOSIS — E876 Hypokalemia: Secondary | ICD-10-CM

## 2021-09-05 HISTORY — DX: Unspecified asthma, uncomplicated: J45.909

## 2021-09-05 HISTORY — DX: Essential (primary) hypertension: I10

## 2021-09-06 NOTE — Telephone Encounter (Signed)
noted 

## 2021-09-06 NOTE — Telephone Encounter (Signed)
Pt called and stated she is now on Olmesartan for her blood pressure instead of chlorthalidone.

## 2021-09-10 ENCOUNTER — Ambulatory Visit (HOSPITAL_COMMUNITY): Payer: Medicaid Other | Admitting: Anesthesiology

## 2021-09-10 ENCOUNTER — Ambulatory Visit (HOSPITAL_COMMUNITY)
Admission: RE | Admit: 2021-09-10 | Discharge: 2021-09-10 | Disposition: A | Discharge status: Home / Self Care | Payer: Medicaid Other | Attending: Internal Medicine | Admitting: Internal Medicine

## 2021-09-10 ENCOUNTER — Encounter (HOSPITAL_COMMUNITY)
Admission: RE | Disposition: A | Discharge status: Home / Self Care | Payer: Self-pay | Source: Home / Self Care | Attending: Internal Medicine

## 2021-09-10 ENCOUNTER — Encounter (HOSPITAL_COMMUNITY): Payer: Self-pay

## 2021-09-10 ENCOUNTER — Ambulatory Visit (HOSPITAL_BASED_OUTPATIENT_CLINIC_OR_DEPARTMENT_OTHER): Payer: Medicaid Other | Admitting: Anesthesiology

## 2021-09-10 DIAGNOSIS — Z87891 Personal history of nicotine dependence: Secondary | ICD-10-CM

## 2021-09-10 DIAGNOSIS — J449 Chronic obstructive pulmonary disease, unspecified: Secondary | ICD-10-CM | POA: Insufficient documentation

## 2021-09-10 DIAGNOSIS — K635 Polyp of colon: Secondary | ICD-10-CM | POA: Diagnosis not present

## 2021-09-10 DIAGNOSIS — B009 Herpesviral infection, unspecified: Secondary | ICD-10-CM

## 2021-09-10 DIAGNOSIS — Z01419 Encounter for gynecological examination (general) (routine) without abnormal findings: Secondary | ICD-10-CM

## 2021-09-10 DIAGNOSIS — D123 Benign neoplasm of transverse colon: Secondary | ICD-10-CM | POA: Insufficient documentation

## 2021-09-10 DIAGNOSIS — N951 Menopausal and female climacteric states: Secondary | ICD-10-CM

## 2021-09-10 DIAGNOSIS — Z1211 Encounter for screening for malignant neoplasm of colon: Secondary | ICD-10-CM | POA: Insufficient documentation

## 2021-09-10 DIAGNOSIS — B3731 Acute candidiasis of vulva and vagina: Secondary | ICD-10-CM

## 2021-09-10 DIAGNOSIS — Z79899 Other long term (current) drug therapy: Secondary | ICD-10-CM

## 2021-09-10 DIAGNOSIS — F418 Other specified anxiety disorders: Secondary | ICD-10-CM | POA: Diagnosis not present

## 2021-09-10 DIAGNOSIS — K648 Other hemorrhoids: Secondary | ICD-10-CM

## 2021-09-10 DIAGNOSIS — E46 Unspecified protein-calorie malnutrition: Secondary | ICD-10-CM

## 2021-09-10 DIAGNOSIS — E876 Hypokalemia: Secondary | ICD-10-CM

## 2021-09-10 DIAGNOSIS — Z8619 Personal history of other infectious and parasitic diseases: Secondary | ICD-10-CM

## 2021-09-10 DIAGNOSIS — E669 Obesity, unspecified: Secondary | ICD-10-CM

## 2021-09-10 HISTORY — PX: POLYPECTOMY: SHX5525

## 2021-09-10 HISTORY — PX: COLONOSCOPY WITH PROPOFOL: SHX5780

## 2021-09-10 SURGERY — COLONOSCOPY WITH PROPOFOL
Anesthesia: General

## 2021-09-10 MED ORDER — PROPOFOL 10 MG/ML IV BOLUS
INTRAVENOUS | Status: AC
  Filled 2021-09-10: qty 20

## 2021-09-10 MED ORDER — PROPOFOL 10 MG/ML IV BOLUS
INTRAVENOUS | Status: DC | PRN
  Administered 2021-09-10 (×2): 50 mg via INTRAVENOUS
  Administered 2021-09-10: 90 mg via INTRAVENOUS
  Administered 2021-09-10: 40 mg via INTRAVENOUS
  Administered 2021-09-10: 50 mg via INTRAVENOUS

## 2021-09-10 MED ORDER — LACTATED RINGERS IV SOLN: INTRAVENOUS | Status: DC | PRN

## 2021-09-10 MED ORDER — LIDOCAINE HCL (CARDIAC) PF 100 MG/5ML IV SOSY
PREFILLED_SYRINGE | INTRAVENOUS | Status: DC | PRN
  Administered 2021-09-10: 40 mg via INTRATRACHEAL

## 2021-09-10 MED ORDER — LACTATED RINGERS IV SOLN
INTRAVENOUS | Status: DC
Start: 2021-09-10 — End: 2021-09-10

## 2021-09-10 NOTE — Op Note (Signed)
[facility] Patient Name: [name] Procedure Date: 09/10/2021 2:22 PM MRN: [id number] Date of Birth: 1960/10/19 Attending MD: Elon Alas. Abbey Chatters DO CSN: [id number] Age: 61 Admit Type: Outpatient Procedure:                Colonoscopy Indications:              Screening for colorectal malignant neoplasm Providers:                Elon Alas. Abbey Chatters, DO, Lambert Mody, Caprice Kluver, Raphael Gibney, Technician, Aram Candela Referring MD:              Medicines:                See the Anesthesia note for documentation of the                            administered medications Complications:            No immediate complications. Estimated Blood Loss:     Estimated blood loss was minimal. Procedure:                Pre-Anesthesia Assessment:                           - The anesthesia plan was to use monitored                            anesthesia care (MAC).                           After obtaining informed consent, the colonoscope                            was passed under direct vision. Throughout the                            procedure, the patient's blood pressure, pulse, and                            oxygen saturations were monitored continuously. The                            PCF-HQ190L ([id number]) scope was introduced through                            the anus and advanced to the the cecum, identified                            by appendiceal orifice and ileocecal valve. The                            colonoscopy was performed without difficulty. The                            patient tolerated the  procedure well. The quality                            of the bowel preparation was evaluated using the                            BBPS [facility] Bowel Preparation Scale) with scores                            of: Right Colon = 2 (minor amount of residual                            staining, small fragments of stool and/or opaque                             liquid, but mucosa seen well), Transverse Colon = 2                            (minor amount of residual staining, small fragments                            of stool and/or opaque liquid, but mucosa seen                            well) and Left Colon = 2 (minor amount of residual                            staining, small fragments of stool and/or opaque                            liquid, but mucosa seen well). The total BBPS score                            equals 6. The quality of the bowel preparation was                            fair. Scope In: 2:33:36 PM Scope Out: 2:49:40 PM Scope Withdrawal Time: 0 hours 9 minutes 14 seconds  Total Procedure Duration: 0 hours 16 minutes 4 seconds  Findings:      The perianal and digital rectal examinations were normal.      Non-bleeding internal hemorrhoids were found during endoscopy.      A 4 mm polyp was found in the transverse colon. The polyp was sessile.       The polyp was removed with a cold snare. Resection and retrieval were       complete.      The exam was otherwise without abnormality. Impression:               - Preparation of the colon was fair.                           - Non-bleeding internal hemorrhoids.                           -  One 4 mm polyp in the transverse colon, removed                            with a cold snare. Resected and retrieved.                           - The examination was otherwise normal. Moderate Sedation:      Per Anesthesia Care Recommendation:           - Patient has a contact number available for                            emergencies. The signs and symptoms of potential                            delayed complications were discussed with the                            patient. Return to normal activities tomorrow.                            Written discharge instructions were provided to the                            patient.                           - Resume previous diet.                            - Continue present medications.                           - Await pathology results.                           - Repeat colonoscopy in 5 years for surveillance.                           - Return to GI clinic PRN. Procedure Code(s):        --- Professional ---                           [phone], Colonoscopy, flexible; with removal of                            tumor(s), polyp(s), or other lesion(s) by snare                            technique Diagnosis Code(s):        --- Professional ---                           Z12.11, Encounter for screening for malignant  neoplasm of colon                           K64.8, Other hemorrhoids                           K63.5, Polyp of colon CPT copyright 2019 American Medical Association. All rights reserved. The codes documented in this report are preliminary and upon coder review may  be revised to meet current compliance requirements. Elon Alas. Abbey Chatters, DO [place] Abbey Chatters, DO 09/10/2021 2:51:34 PM This report has been signed electronically. Number of Addenda: 0

## 2021-09-10 NOTE — Transfer of Care (Signed)
Immediate Anesthesia Transfer of Care Note  Patient: [name]  Procedure(s) Performed: COLONOSCOPY WITH PROPOFOL POLYPECTOMY  Patient Location: Short Stay  Anesthesia Type:General  Level of Consciousness: awake  Airway & Oxygen Therapy: Patient Spontanous Breathing  Post-op Assessment: Report given to RN  Post vital signs: Reviewed and stable  Last Vitals:  Vitals Value Taken Time  BP    Temp    Pulse    Resp    SpO2      Last Pain:  Vitals:   09/10/21 1353  TempSrc: Oral  PainSc: 0-No pain      Patients Stated Pain Goal: 5 (60/04/59 9774)  Complications: No notable events documented.

## 2021-09-10 NOTE — H&P (Signed)
Primary Care Physician:  Sofie Rower, PA-C Primary Gastroenterologist:  Dr. Abbey Chatters  Pre-Procedure History & Physical: HPI:  [name] is a 61 y.o. female is here for a colonoscopy for colon cancer screening purposes.  Patient denies any family history of colorectal cancer.  No melena or hematochezia.  No abdominal pain or unintentional weight loss.  No change in bowel habits.  Overall feels well from a GI standpoint.  Past Medical History:  Diagnosis Date   Anxiety    Asthma    COPD (chronic obstructive pulmonary disease) ([place])    Depression    Fibromyalgia    Hepatitis C    History of hepatitis C 10/09/2020   History of herpes simplex type 2 infection    History of pancreatitis    History of pelvic fracture    Hyperlipidemia    Hypertension    Osteoporosis     Past Surgical History:  Procedure Laterality Date   ABDOMINAL HYSTERECTOMY     BILATERAL SALPINGOOPHORECTOMY     MANDIBLE SURGERY      Prior to Admission medications   Medication Sig Start Date End Date Taking? Authorizing Provider  albuterol (VENTOLIN HFA) 108 (90 Base) MCG/ACT inhaler Inhale 2 puffs into the lungs daily as needed (bad breathing spell when walking).   Yes [provider]  alendronate (FOSAMAX) 70 MG tablet Take 70 mg by mouth once a week. 06/11/21  Yes [provider]  ALPRAZolam Duanne Moron) 1 MG tablet Take 1 mg by mouth daily.   Yes [provider]  amLODipine (NORVASC) 5 MG tablet Take 5 mg by mouth daily. 06/11/21  Yes [provider]  aspirin EC 81 MG tablet Take 81 mg by mouth daily.   Yes [provider]  buprenorphine-naloxone (SUBOXONE) 2-0.5 MG SUBL SL tablet Place 0.5 tablets under the tongue daily.    Yes [provider]  chlorthalidone (HYGROTON) 25 MG tablet Take 25 mg by mouth daily. 06/11/21  Yes [provider]  Cholecalciferol (VITAMIN D-3) 125 MCG (5000 UT) TABS Take by mouth.   Yes [provider]   cyclobenzaprine (FLEXERIL) 10 MG tablet Take 10 mg by mouth 3 (three) times daily as needed for muscle spasms.   Yes [provider]  estradiol (ESTRACE) 1 MG tablet Take 1 tablet (1 mg total) by mouth daily. 07/12/21  Yes Florian Buff, MD  ibuprofen (ADVIL) 200 MG tablet Take 800 mg by mouth daily.   Yes [provider]  lovastatin (MEVACOR) 40 MG tablet Take 40 mg by mouth at bedtime.   Yes [provider]  meloxicam (MOBIC) 7.5 MG tablet Take 7.5 mg by mouth daily as needed for pain.   Yes [provider]  mometasone-formoterol (DULERA) 200-5 MCG/ACT AERO Inhale 2 puffs into the lungs 2 (two) times daily.   Yes [provider]  Multiple Vitamins-Minerals (ONE-A-DAY WOMENS 50+ ADVANTAGE PO) Take 1 tablet by mouth daily.   Yes [provider]  phentermine (ADIPEX-P) 37.5 MG tablet TAKE 1 TABLET BY MOUTH EVERY MORNING 2 HOURS BEFORE BREAKFAST WITH 8 OZ OF WATER: 5LB WEIGHT LOSS PER MONTH. Patient not taking: Reported on 07/30/2021 02/10/18  Yes Jonnie Kind, MD  polyethylene glycol-electrolytes (NULYTELY) 420 g solution As directed 08/15/21  Yes Reford Olliff, Elon Alas, DO  Sod Picosulfate-Mag Ox-Cit Acd (CLENPIQ) 10-3.5-12 MG-GM -GM/175ML SOLN Take 1 kit by mouth as directed. 06/20/21  Yes Malaika Arnall K, DO  SPIRIVA RESPIMAT 2.5 MCG/ACT AERS Inhale 2 puffs into the  lungs daily. 06/11/21  Yes [provider]  acyclovir (ZOVIRAX) 400 MG tablet Take 1 twice daily for outbreak 07/30/21   Estill Dooms, NP    Allergies as of 06/20/2021 - Review Complete 06/20/2021  Allergen Reaction Noted   Macrobid [nitrofurantoin] Rash and Other (See Comments) 10/09/2020   Other Other (See Comments) 12/04/2010    Family History  Problem Relation Age of Onset   Cancer Mother    Heart disease Father    Heart attack Father     Social History   Socioeconomic History   Marital status: Married    Spouse name: Not on file   Number of children:  Not on file   Years of education: Not on file   Highest education level: Not on file  Occupational History   Not on file  Tobacco Use   Smoking status: Former    Years: 33.00    Types: Cigarettes    Quit date: 11/11/2012    Years since quitting: 8.8   Smokeless tobacco: Never  Vaping Use   Vaping Use: Never used  Substance and Sexual Activity   Alcohol use: No   Drug use: No   Sexual activity: Not Currently    Birth control/protection: Surgical    Comment: hyst  Other Topics Concern   Not on file  Social History Narrative   Not on file   Social Determinants of Health   Financial Resource Strain: Low Risk  (06/21/2019)   Overall Financial Resource Strain (CARDIA)    Difficulty of Paying Living Expenses: Not hard at all  Food Insecurity: Not on file  Transportation Needs: No Transportation Needs (06/21/2019)   PRAPARE - Hydrologist (Medical): No    Lack of Transportation (Non-Medical): No  Physical Activity: Inactive (06/21/2019)   Exercise Vital Sign    Days of Exercise per Week: 0 days    Minutes of Exercise per Session: 10 min  Stress: No Stress Concern Present (06/21/2019)   [place]    Feeling of Stress : Only a little  Social Connections: Moderately Isolated (06/21/2019)   Social Connection and Isolation Panel [NHANES]    Frequency of Communication with Friends and Family: Three times a week    Frequency of Social Gatherings with Friends and Family: Once a week    Attends Religious Services: Never    Marine scientist or Organizations: No    Attends Archivist Meetings: Never    Marital Status: Married  Human resources officer Violence: Not At Risk (06/21/2019)   Humiliation, Afraid, Rape, and Kick questionnaire    Fear of Current or Ex-Partner: No    Emotionally Abused: No    Physically Abused: No    Sexually Abused: No    Review of Systems: See HPI,  otherwise negative ROS  Physical Exam: Vital signs in last 24 hours:     General:   Alert,  Well-developed, well-nourished, pleasant and cooperative in NAD Head:  Normocephalic and atraumatic. Eyes:  Sclera clear, no icterus.   Conjunctiva pink. Ears:  Normal auditory acuity. Nose:  No deformity, discharge,  or lesions. Mouth:  No deformity or lesions, dentition normal. Neck:  Supple; no masses or thyromegaly. Lungs:  Clear throughout to auscultation.   No wheezes, crackles, or rhonchi. No acute distress. Heart:  Regular rate and rhythm; no murmurs, clicks, rubs,  or gallops. Abdomen:  Soft, nontender and nondistended. No masses, hepatosplenomegaly or hernias  noted. Normal bowel sounds, without guarding, and without rebound.   Msk:  Symmetrical without gross deformities. Normal posture. Extremities:  Without clubbing or edema. Neurologic:  Alert and  oriented x4;  grossly normal neurologically. Skin:  Intact without significant lesions or rashes. Cervical Nodes:  No significant cervical adenopathy. Psych:  Alert and cooperative. Normal mood and affect.  Impression/Plan: Damiya L Brodbeck is here for a colonoscopy to be performed for colon cancer screening purposes.  The risks of the procedure including infection, bleed, or perforation as well as benefits, limitations, alternatives and imponderables have been reviewed with the patient. Questions have been answered. All parties agreeable.

## 2021-09-10 NOTE — Anesthesia Preprocedure Evaluation (Signed)
Anesthesia Evaluation  Patient identified by MRN, date of birth, ID band Patient awake    Reviewed: Allergy & Precautions, H&P , NPO status , Patient's Chart, lab work & pertinent test results, reviewed documented beta blocker date and time   Airway Mallampati: II  TM Distance: >3 FB Neck ROM: full    Dental no notable dental hx.    Pulmonary asthma , COPD,  COPD inhaler, former smoker,    Pulmonary exam normal breath sounds clear to auscultation       Cardiovascular Exercise Tolerance: Good hypertension, negative cardio ROS   Rhythm:regular Rate:Normal     Neuro/Psych PSYCHIATRIC DISORDERS Anxiety Depression  Neuromuscular disease    GI/Hepatic negative GI ROS, (+) Hepatitis -, C  Endo/Other  negative endocrine ROS  Renal/GU negative Renal ROS  negative genitourinary   Musculoskeletal   Abdominal   Peds  Hematology negative hematology ROS (+)   Anesthesia Other Findings   Reproductive/Obstetrics negative OB ROS                             Anesthesia Physical Anesthesia Plan  ASA: 3  Anesthesia Plan: General   Post-op Pain Management:    Induction:   PONV Risk Score and Plan: Propofol infusion  Airway Management Planned:   Additional Equipment:   Intra-op Plan:   Post-operative Plan:   Informed Consent: I have reviewed the patients History and Physical, chart, labs and discussed the procedure including the risks, benefits and alternatives for the proposed anesthesia with the patient or authorized representative who has indicated his/her understanding and acceptance.     Dental Advisory Given  Plan Discussed with: CRNA  Anesthesia Plan Comments:         Anesthesia Quick Evaluation

## 2021-09-10 NOTE — Discharge Instructions (Addendum)

## 2021-09-10 NOTE — Anesthesia Postprocedure Evaluation (Signed)
Anesthesia Post Note  Patient: [name]  Procedure(s) Performed: COLONOSCOPY WITH PROPOFOL POLYPECTOMY  Patient location during evaluation: Short Stay Anesthesia Type: General Level of consciousness: awake and alert Pain management: pain level controlled Vital Signs Assessment: post-procedure vital signs reviewed and stable Respiratory status: spontaneous breathing Cardiovascular status: blood pressure returned to baseline and stable Postop Assessment: no apparent nausea or vomiting Anesthetic complications: no   No notable events documented.   Last Vitals:  Vitals:   09/10/21 1353 09/10/21 1455  BP: 105/64   Pulse: 91 90  Resp: 18 20  Temp: 36.4 C 36.5 C  SpO2: 95% 97%    Last Pain:  Vitals:   09/10/21 1455  TempSrc: Axillary  PainSc: 0-No pain                 Dayon Witt

## 2021-09-10 NOTE — Progress Notes (Addendum)
No Istat needed per Dr Briant Cedar.  Patient stated her medical doctor checked it last week and her K+ was OK

## 2021-09-13 LAB — SURGICAL PATHOLOGY

## 2021-09-17 ENCOUNTER — Encounter (HOSPITAL_COMMUNITY): Payer: Self-pay | Admitting: Internal Medicine

## 2021-10-01 ENCOUNTER — Encounter: Payer: Self-pay | Admitting: Obstetrics & Gynecology

## 2021-10-19 ENCOUNTER — Other Ambulatory Visit (INDEPENDENT_AMBULATORY_CARE_PROVIDER_SITE_OTHER): Payer: Medicaid Other | Admitting: *Deleted

## 2021-10-19 ENCOUNTER — Other Ambulatory Visit: Payer: Self-pay | Admitting: Adult Health

## 2021-10-19 DIAGNOSIS — R350 Frequency of micturition: Secondary | ICD-10-CM

## 2021-10-19 DIAGNOSIS — R8290 Unspecified abnormal findings in urine: Secondary | ICD-10-CM

## 2021-10-19 LAB — POCT URINALYSIS DIPSTICK
Glucose, UA: NEGATIVE
Ketones, UA: NEGATIVE
Nitrite, UA: NEGATIVE
Protein, UA: NEGATIVE

## 2021-10-19 MED ORDER — AMPICILLIN 500 MG PO CAPS: 500.0000 mg | ORAL_CAPSULE | Freq: Four times a day (QID) | ORAL | 0 refills | Status: DC

## 2021-10-19 NOTE — Progress Notes (Signed)
   NURSE VISIT- UTI SYMPTOMS   SUBJECTIVE:  [name] is a 61 y.o. G5P4004 female here for UTI symptoms. She is a GYN patient. She reports  urinary frequency and odor with urine X 5 days .  OBJECTIVE:  There were no vitals taken for this visit.  Appears well, in no apparent distress  Results for orders placed or performed in visit on 10/19/21 (from the past 24 hour(s))  POCT Urinalysis Dipstick   Collection Time: 10/19/21 12:49 PM  Result Value Ref Range   Color, UA     Clarity, UA     Glucose, UA Negative Negative   Bilirubin, UA     Ketones, UA neg    Spec Grav, UA     Blood, UA trace    pH, UA     Protein, UA Negative Negative   Urobilinogen, UA     Nitrite, UA neg    Leukocytes, UA Moderate (2+) (A) Negative   Appearance     Odor      ASSESSMENT: GYN patient with UTI symptoms and negative nitrites  PLAN: Discussed with Derrek Monaco, AGNP   Rx sent by provider today: Yes Urine culture sent Call or return to clinic prn if these symptoms worsen or fail to improve as anticipated. Follow-up: as needed   Levy Pupa  10/19/2021 12:50 PM

## 2021-10-19 NOTE — Progress Notes (Signed)
Rx ampicillin

## 2021-10-20 LAB — MICROSCOPIC EXAMINATION
Bacteria, UA: NONE SEEN
Casts: NONE SEEN /lpf
Epithelial Cells (non renal): NONE SEEN /hpf (ref 0–10)
WBC, UA: >30 /hpf — AB (ref 0–5)

## 2021-10-20 LAB — URINALYSIS, ROUTINE W REFLEX MICROSCOPIC
Bilirubin, UA: NEGATIVE
Glucose, UA: NEGATIVE
Ketones, UA: NEGATIVE
Nitrite, UA: NEGATIVE
Protein,UA: NEGATIVE
RBC, UA: NEGATIVE
Specific Gravity, UA: 1.01 (ref 1.005–1.030)
Urobilinogen, Ur: 0.2 mg/dL (ref 0.2–1.0)
pH, UA: 7 (ref 5.0–7.5)

## 2021-10-24 LAB — URINE CULTURE

## 2021-10-29 ENCOUNTER — Other Ambulatory Visit (INDEPENDENT_AMBULATORY_CARE_PROVIDER_SITE_OTHER): Payer: Medicaid Other | Admitting: *Deleted

## 2021-10-29 DIAGNOSIS — R350 Frequency of micturition: Secondary | ICD-10-CM

## 2021-10-29 DIAGNOSIS — R309 Painful micturition, unspecified: Secondary | ICD-10-CM | POA: Diagnosis not present

## 2021-10-29 LAB — POCT URINALYSIS DIPSTICK
Glucose, UA: NEGATIVE
Ketones, UA: NEGATIVE
Nitrite, UA: NEGATIVE
Protein, UA: POSITIVE — AB

## 2021-10-29 NOTE — Progress Notes (Signed)
   NURSE VISIT- UTI SYMPTOMS   SUBJECTIVE:  [name] is a 61 y.o. G65P4004 female here for UTI symptoms. She is a GYN patient. She reports  urinary frequency, + pressure and pain with urination. Started again last night .  OBJECTIVE:  There were no vitals taken for this visit.  Appears well, in no apparent distress  Results for orders placed or performed in visit on 10/29/21 (from the past 24 hour(s))  POCT Urinalysis Dipstick   Collection Time: 10/29/21  1:55 PM  Result Value Ref Range   Color, UA     Clarity, UA     Glucose, UA Negative Negative   Bilirubin, UA     Ketones, UA neg    Spec Grav, UA     Blood, UA small    pH, UA     Protein, UA Positive (A) Negative   Urobilinogen, UA     Nitrite, UA neg    Leukocytes, UA Trace (A) Negative   Appearance     Odor      ASSESSMENT: GYN patient with UTI symptoms and negative nitrites  PLAN: Discussed with Derrek Monaco, AGNP   Rx sent by provider today: No Urine culture sent Call or return to clinic prn if these symptoms worsen or fail to improve as anticipated. Follow-up: as needed   Levy Pupa  10/29/2021 2:04 PM

## 2021-10-30 LAB — MICROSCOPIC EXAMINATION
Bacteria, UA: NONE SEEN
Casts: NONE SEEN /lpf

## 2021-10-30 LAB — URINALYSIS, ROUTINE W REFLEX MICROSCOPIC
Bilirubin, UA: NEGATIVE
Glucose, UA: NEGATIVE
Ketones, UA: NEGATIVE
Leukocytes,UA: NEGATIVE
Nitrite, UA: NEGATIVE
Specific Gravity, UA: 1.014 (ref 1.005–1.030)
Urobilinogen, Ur: 0.2 mg/dL (ref 0.2–1.0)
pH, UA: 6 (ref 5.0–7.5)

## 2021-10-31 ENCOUNTER — Other Ambulatory Visit: Payer: Self-pay | Admitting: Adult Health

## 2021-10-31 DIAGNOSIS — R319 Hematuria, unspecified: Secondary | ICD-10-CM

## 2021-10-31 LAB — URINE CULTURE

## 2021-10-31 NOTE — Progress Notes (Signed)
Will refer to urology

## 2021-11-08 ENCOUNTER — Ambulatory Visit: Payer: Medicaid Other | Admitting: Urology

## 2021-11-08 NOTE — Progress Notes (Deleted)
Assessment: 1. Microscopic hematuria      Plan: ***  Chief Complaint: No chief complaint on file.   History of Present Illness:  [name] is a 61 y.o. female who is seen in consultation from Derrek Monaco, NP for evaluation of hematuria. She was evaluated for UTI symptoms in September 2023.  Urinalysis showed >30 WBCs.  Urine culture grew >100 K E. coli.  She was treated with amoxicillin.  A repeat urinalysis from 10/30/2021 showed 6-10 WBCs, 11-30 RBCs.   Past Medical History:  Past Medical History:  Diagnosis Date   Anxiety    Asthma    COPD (chronic obstructive pulmonary disease) ([place])    Depression    Fibromyalgia    Hepatitis C    History of hepatitis C 10/09/2020   History of herpes simplex type 2 infection    History of pancreatitis    History of pelvic fracture    Hyperlipidemia    Hypertension    Osteoporosis     Past Surgical History:  Past Surgical History:  Procedure Laterality Date   ABDOMINAL HYSTERECTOMY     BILATERAL SALPINGOOPHORECTOMY     COLONOSCOPY WITH PROPOFOL N/A 09/10/2021   Procedure: COLONOSCOPY WITH PROPOFOL;  Surgeon: Eloise Harman, DO;  Location: [facility];  Service: Endoscopy;  Laterality: N/A;  2:30pm   MANDIBLE SURGERY     POLYPECTOMY  09/10/2021   Procedure: POLYPECTOMY;  Surgeon: Eloise Harman, DO;  Location: [facility];  Service: Endoscopy;;    Allergies:  Allergies  Allergen Reactions   Macrobid [Nitrofurantoin] Itching and Rash    Fever after each dose   Other Other (See Comments)    Tape-If on for long periods of time, cause redness     Family History:  Family History  Problem Relation Age of Onset   Cancer Mother    Heart disease Father    Heart attack Father     Social History:  Social History   Tobacco Use   Smoking status: Former    Years: 33.00    Types: Cigarettes    Quit date: 11/11/2012    Years since quitting: 8.9   Smokeless tobacco: Never  Vaping Use   Vaping Use:  Never used  Substance Use Topics   Alcohol use: No   Drug use: No    Review of symptoms:  Constitutional:  Negative for unexplained weight loss, night sweats, fever, chills ENT:  Negative for nose bleeds, sinus pain, painful swallowing CV:  Negative for chest pain, shortness of breath, exercise intolerance, palpitations, loss of consciousness Resp:  Negative for cough, wheezing, shortness of breath GI:  Negative for nausea, vomiting, diarrhea, bloody stools GU:  Positives noted in HPI; otherwise negative for gross hematuria, dysuria, urinary incontinence Neuro:  Negative for seizures, poor balance, limb weakness, slurred speech Psych:  Negative for lack of energy, depression, anxiety Endocrine:  Negative for polydipsia, polyuria, symptoms of hypoglycemia (dizziness, hunger, sweating) Hematologic:  Negative for anemia, purpura, petechia, prolonged or excessive bleeding, use of anticoagulants  Allergic:  Negative for difficulty breathing or choking as a result of exposure to anything; no shellfish allergy; no allergic response (rash/itch) to materials, foods  Physical exam: There were no vitals taken for this visit. GENERAL APPEARANCE:  Well appearing, well developed, well nourished, NAD HEENT: Atraumatic, Normocephalic, oropharynx clear. NECK: Supple without lymphadenopathy or thyromegaly. LUNGS: Clear to auscultation bilaterally. HEART: Regular Rate and Rhythm without murmurs, gallops, or rubs. ABDOMEN: Soft, non-tender, No Masses. EXTREMITIES:  Moves all extremities well.  Without clubbing, cyanosis, or edema. NEUROLOGIC:  Alert and oriented x 3, normal gait, CN II-XII grossly intact.  MENTAL STATUS:  Appropriate. BACK:  Non-tender to palpation.  No CVAT SKIN:  Warm, dry and intact.    Results: U/A:

## 2021-11-12 ENCOUNTER — Ambulatory Visit: Payer: Medicaid Other | Admitting: Urology

## 2021-11-12 ENCOUNTER — Encounter: Payer: Self-pay | Admitting: Urology

## 2021-11-12 VITALS — BP 157/76 | HR 89 | Ht 61.0 in | Wt 185.0 lb

## 2021-11-12 DIAGNOSIS — R350 Frequency of micturition: Secondary | ICD-10-CM | POA: Insufficient documentation

## 2021-11-12 DIAGNOSIS — R3129 Other microscopic hematuria: Secondary | ICD-10-CM | POA: Insufficient documentation

## 2021-11-12 DIAGNOSIS — Z87440 Personal history of urinary (tract) infections: Secondary | ICD-10-CM | POA: Diagnosis not present

## 2021-11-12 LAB — BLADDER SCAN AMB NON-IMAGING: Scan Result: 50

## 2021-11-12 MED ORDER — MIRABEGRON ER 50 MG PO TB24: 50.0000 mg | ORAL_TABLET | Freq: Every day | ORAL | 0 refills | Status: DC

## 2021-11-12 NOTE — Progress Notes (Signed)
Assessment: 1. Microscopic hematuria   2. History of UTI   3. Urinary frequency     Plan: I personally reviewed the patient records including provider notes and lab results. The microscopic hematuria was likely secondary to her UTI as her urinalysis is unremarkable today. Methods to reduce the risk of UTIs discussed including increased fluid intake, timed and double voiding, daily cranberry supplement, and daily probiotics. Bladder diet sheet given Recommend trial of Myrbetriq 25 mg daily.  Samples provided.  Use and side effects discussed. Return to office in 1 month.  Chief Complaint:  Chief Complaint  Patient presents with   Hematuria    History of Present Illness:  [name] is a 61 y.o. female who is seen in consultation from Derrek Monaco, NP for evaluation of microscopic hematuria and urinary frequency.  She was evaluated for UTI symptoms in September 2023.  Urinalysis showed >30 WBCs.  Urine culture grew >100 K E. coli.  She was treated with amoxicillin.  A repeat urinalysis from 10/30/2021 showed 6-10 WBCs, 11-30 RBCs.  She has continued to have significant urinary frequency, voiding approximately every 10-15 minutes.  She also has urgency and nocturia 3-4 times.  She was having some discomfort with voiding last week, but this has resolved.  She is not having any incontinence.  No flank pain or gross hematuria.  No recent UTIs.  She has a remote history of a kidney stone a number of years ago.   Past Medical History:  Past Medical History:  Diagnosis Date   Anxiety    Asthma    COPD (chronic obstructive pulmonary disease) ([place])    Depression    Fibromyalgia    Hepatitis C    History of hepatitis C 10/09/2020   History of herpes simplex type 2 infection    History of pancreatitis    History of pelvic fracture    Hyperlipidemia    Hypertension    Osteoporosis     Past Surgical History:  Past Surgical History:  Procedure Laterality Date   ABDOMINAL  HYSTERECTOMY     BILATERAL SALPINGOOPHORECTOMY     COLONOSCOPY WITH PROPOFOL N/A 09/10/2021   Procedure: COLONOSCOPY WITH PROPOFOL;  Surgeon: Eloise Harman, DO;  [facility];  Service: Endoscopy;  Laterality: N/A;  2:30pm   MANDIBLE SURGERY     POLYPECTOMY  09/10/2021   Procedure: POLYPECTOMY;  Surgeon: Eloise Harman, DO;  Location: [facility];  Service: Endoscopy;;    Allergies:  Allergies  Allergen Reactions   Macrobid [Nitrofurantoin] Itching and Rash    Fever after each dose   Other Other (See Comments)    Tape-If on for long periods of time, cause redness     Family History:  Family History  Problem Relation Age of Onset   Cancer Mother    Heart disease Father    Heart attack Father     Social History:  Social History   Tobacco Use   Smoking status: Former    Years: 33.00    Types: Cigarettes    Quit date: 11/11/2012    Years since quitting: 9.0   Smokeless tobacco: Never  Vaping Use   Vaping Use: Never used  Substance Use Topics   Alcohol use: No   Drug use: No    Review of symptoms:  Constitutional:  Negative for unexplained weight loss, night sweats, fever, chills ENT:  Negative for nose bleeds, sinus pain, painful swallowing CV:  Negative for chest pain, shortness  of breath, exercise intolerance, palpitations, loss of consciousness Resp:  Negative for cough, wheezing, shortness of breath GI:  Negative for nausea, vomiting, diarrhea, bloody stools GU:  Positives noted in HPI; otherwise negative for gross hematuria, urinary incontinence Neuro:  Negative for seizures, poor balance, limb weakness, slurred speech Psych:  Negative for lack of energy, depression, anxiety Endocrine:  Negative for polydipsia, polyuria, symptoms of hypoglycemia (dizziness, hunger, sweating) Hematologic:  Negative for anemia, purpura, petechia, prolonged or excessive bleeding, use of anticoagulants  Allergic:  Negative for difficulty breathing or choking as a  result of exposure to anything; no shellfish allergy; no allergic response (rash/itch) to materials, foods  Physical exam: BP (!) 157/76   Pulse 89   Ht '5\' 1"'$  (1.549 m)   Wt 185 lb (83.9 kg)   BMI 34.96 kg/m  GENERAL APPEARANCE:  Well appearing, well developed, well nourished, NAD HEENT: Atraumatic, Normocephalic, oropharynx clear. NECK: Supple without lymphadenopathy or thyromegaly. LUNGS: Clear to auscultation bilaterally. HEART: Regular Rate and Rhythm without murmurs, gallops, or rubs. ABDOMEN: Soft, non-tender, No Masses. EXTREMITIES: Moves all extremities well.  Without clubbing, cyanosis, or edema. NEUROLOGIC:  Alert and oriented x 3, normal gait, CN II-XII grossly intact.  MENTAL STATUS:  Appropriate. BACK:  Non-tender to palpation.  No CVAT SKIN:  Warm, dry and intact.    Results: U/A:  dipstick negative  PVR:  50 ml

## 2021-11-12 NOTE — Progress Notes (Signed)
post void residual =50 

## 2021-11-13 LAB — URINALYSIS, ROUTINE W REFLEX MICROSCOPIC
Bilirubin, UA: NEGATIVE
Glucose, UA: NEGATIVE
Ketones, UA: NEGATIVE
Leukocytes,UA: NEGATIVE
Nitrite, UA: NEGATIVE
Protein,UA: NEGATIVE
RBC, UA: NEGATIVE
Specific Gravity, UA: 1.01 (ref 1.005–1.030)
Urobilinogen, Ur: 0.2 mg/dL (ref 0.2–1.0)
pH, UA: 5.5 (ref 5.0–7.5)

## 2021-12-18 ENCOUNTER — Ambulatory Visit: Payer: Medicaid Other | Admitting: Urology

## 2021-12-18 DIAGNOSIS — R3129 Other microscopic hematuria: Secondary | ICD-10-CM

## 2021-12-18 DIAGNOSIS — Z87440 Personal history of urinary (tract) infections: Secondary | ICD-10-CM

## 2021-12-18 NOTE — Progress Notes (Deleted)
Assessment: 1. Microscopic hematuria   2. History of UTI   3. Urinary frequency     Plan: I personally reviewed the patient records including provider notes and lab results. The microscopic hematuria was likely secondary to her UTI as her urinalysis is unremarkable today. Methods to reduce the risk of UTIs discussed including increased fluid intake, timed and double voiding, daily cranberry supplement, and daily probiotics. Bladder diet sheet given Recommend trial of Myrbetriq 25 mg daily.  Samples provided.  Use and side effects discussed. Return to office in 1 month.  Chief Complaint:  No chief complaint on file.   History of Present Illness:  [name] is a 61 y.o. female who is seen for continued evaluation of microscopic hematuria and urinary frequency.  She was evaluated for UTI symptoms in September 2023.  Urinalysis showed >30 WBCs.  Urine culture grew >100 K E. coli.  She was treated with amoxicillin.  A repeat urinalysis from 10/30/2021 showed 6-10 WBCs, 11-30 RBCs.  She has continued to have significant urinary frequency, voiding approximately every 10-15 minutes.  She also has urgency and nocturia 3-4 times.  She was having some discomfort with voiding but this resolved.  No urinary incontinence.  No flank pain or gross hematuria.  She has a remote history of a kidney stone a number of years ago. She was given a trial of Myrbetriq 25 mg daily in 10/23.  Portions of the above documentation were copied from a prior visit for review purposes only.   Past Medical History:  Past Medical History:  Diagnosis Date   Anxiety    Asthma    COPD (chronic obstructive pulmonary disease) ([place])    Depression    Fibromyalgia    Hepatitis C    History of hepatitis C 10/09/2020   History of herpes simplex type 2 infection    History of pancreatitis    History of pelvic fracture    Hyperlipidemia    Hypertension    Osteoporosis     Past Surgical History:  Past Surgical  History:  Procedure Laterality Date   ABDOMINAL HYSTERECTOMY     BILATERAL SALPINGOOPHORECTOMY     COLONOSCOPY WITH PROPOFOL N/A 09/10/2021   Procedure: COLONOSCOPY WITH PROPOFOL;  Surgeon: Eloise Harman, DO;  [facility];  Service: Endoscopy;  Laterality: N/A;  2:30pm   MANDIBLE SURGERY     POLYPECTOMY  09/10/2021   Procedure: POLYPECTOMY;  Surgeon: Eloise Harman, DO;  [facility];  Service: Endoscopy;;    Allergies:  Allergies  Allergen Reactions   Macrobid [Nitrofurantoin] Itching and Rash    Fever after each dose   Other Other (See Comments)    Tape-If on for long periods of time, cause redness     Family History:  Family History  Problem Relation Age of Onset   Cancer Mother    Heart disease Father    Heart attack Father     Social History:  Social History   Tobacco Use   Smoking status: Former    Years: 33.00    Types: Cigarettes    Quit date: 11/11/2012    Years since quitting: 9.1   Smokeless tobacco: Never  Vaping Use   Vaping Use: Never used  Substance Use Topics   Alcohol use: No   Drug use: No    ROS: Constitutional:  Negative for fever, chills, weight loss CV: Negative for chest pain, previous MI, hypertension Respiratory:  Negative for shortness of breath,  wheezing, sleep apnea, frequent cough GI:  Negative for nausea, vomiting, bloody stool, GERD  Physical exam: There were no vitals taken for this visit. GENERAL APPEARANCE:  Well appearing, well developed, well nourished, NAD HEENT:  Atraumatic, normocephalic, oropharynx clear NECK:  Supple without lymphadenopathy or thyromegaly ABDOMEN:  Soft, non-tender, no masses EXTREMITIES:  Moves all extremities well, without clubbing, cyanosis, or edema NEUROLOGIC:  Alert and oriented x 3, normal gait, CN II-XII grossly intact MENTAL STATUS:  appropriate BACK:  Non-tender to palpation, No CVAT SKIN:  Warm, dry, and intact  Results: U/A:

## 2021-12-24 ENCOUNTER — Ambulatory Visit: Payer: Medicaid Other | Admitting: Internal Medicine

## 2022-01-15 ENCOUNTER — Ambulatory Visit: Payer: Medicaid Other | Admitting: Internal Medicine

## 2022-02-07 ENCOUNTER — Ambulatory Visit: Payer: Medicaid Other | Admitting: Internal Medicine

## 2022-02-12 ENCOUNTER — Other Ambulatory Visit: Payer: Self-pay

## 2022-02-12 DIAGNOSIS — Z122 Encounter for screening for malignant neoplasm of respiratory organs: Secondary | ICD-10-CM

## 2022-02-12 DIAGNOSIS — Z87891 Personal history of nicotine dependence: Secondary | ICD-10-CM

## 2022-02-12 NOTE — Progress Notes (Signed)
Order placed for LDCT per protocol. Scan scheduled for 2/6 at 3:45p

## 2022-02-15 ENCOUNTER — Ambulatory Visit: Payer: Medicaid Other | Admitting: Internal Medicine

## 2022-02-22 ENCOUNTER — Ambulatory Visit: Payer: Medicaid Other | Admitting: Internal Medicine

## 2022-03-19 ENCOUNTER — Ambulatory Visit (HOSPITAL_COMMUNITY): Admission: RE | Admit: 2022-03-19 | Payer: Medicaid Other | Source: Ambulatory Visit

## 2022-05-03 ENCOUNTER — Ambulatory Visit (HOSPITAL_COMMUNITY): Payer: Medicaid Other

## 2022-06-11 ENCOUNTER — Other Ambulatory Visit: Payer: Self-pay | Admitting: Obstetrics & Gynecology

## 2022-06-21 ENCOUNTER — Ambulatory Visit (HOSPITAL_COMMUNITY)
Admission: RE | Admit: 2022-06-21 | Discharge: 2022-06-21 | Disposition: A | Discharge status: Home / Self Care | Payer: Medicaid Other | Source: Ambulatory Visit | Attending: Physician Assistant | Admitting: Physician Assistant

## 2022-06-21 DIAGNOSIS — Z122 Encounter for screening for malignant neoplasm of respiratory organs: Secondary | ICD-10-CM | POA: Diagnosis present

## 2022-06-21 DIAGNOSIS — Z87891 Personal history of nicotine dependence: Secondary | ICD-10-CM | POA: Insufficient documentation

## 2022-06-26 NOTE — Progress Notes (Signed)
Patient notified of LDCT Lung Cancer Screening Results via mail with the recommendation to follow-up in 12 months. Patient's referring provider has been sent a copy of results. Results are as follows:   IMPRESSION: Lung-RADS 2, benign appearance or behavior. Continue annual screening with low-dose chest CT without contrast in 12 months.   Aortic Atherosclerosis (ICD10-I70.0) and Emphysema (ICD10-J43.9). 

## 2022-08-01 ENCOUNTER — Ambulatory Visit: Payer: Medicaid Other | Admitting: Adult Health

## 2022-08-08 ENCOUNTER — Ambulatory Visit: Payer: Medicaid Other | Admitting: Adult Health

## 2022-08-20 ENCOUNTER — Ambulatory Visit: Payer: Medicaid Other | Admitting: Adult Health

## 2022-09-02 ENCOUNTER — Encounter: Payer: Self-pay | Admitting: Adult Health

## 2022-09-02 ENCOUNTER — Ambulatory Visit: Payer: Medicaid Other | Admitting: Adult Health

## 2022-09-02 VITALS — BP 137/80 | HR 89 | Ht 62.0 in | Wt 195.0 lb

## 2022-09-02 DIAGNOSIS — R8290 Unspecified abnormal findings in urine: Secondary | ICD-10-CM | POA: Diagnosis not present

## 2022-09-02 DIAGNOSIS — N3001 Acute cystitis with hematuria: Secondary | ICD-10-CM

## 2022-09-02 DIAGNOSIS — R309 Painful micturition, unspecified: Secondary | ICD-10-CM

## 2022-09-02 DIAGNOSIS — B009 Herpesviral infection, unspecified: Secondary | ICD-10-CM

## 2022-09-02 LAB — POCT URINALYSIS DIPSTICK
Glucose, UA: NEGATIVE
Ketones, UA: NEGATIVE
Nitrite, UA: POSITIVE
Protein, UA: NEGATIVE

## 2022-09-02 MED ORDER — ACYCLOVIR 400 MG PO TABS: ORAL_TABLET | ORAL | 0 refills | Status: DC

## 2022-09-02 MED ORDER — PHENAZOPYRIDINE HCL 200 MG PO TABS: 200.0000 mg | ORAL_TABLET | Freq: Three times a day (TID) | ORAL | 0 refills | Status: AC | PRN

## 2022-09-02 MED ORDER — SULFAMETHOXAZOLE-TRIMETHOPRIM 800-160 MG PO TABS: 1.0000 | ORAL_TABLET | Freq: Two times a day (BID) | ORAL | 0 refills | Status: AC

## 2022-09-02 NOTE — Progress Notes (Signed)
  Subjective:     Patient ID: [name], female   DOB: 26-Feb-1960, 62 y.o.   MRN: [id number]  HPI [name] is a 62 year old white female, married, sp hysterectomy in complaining of pain with urination and pressure and cloudy urine, started last week. And requests refill acyclovir.   PCP is [employer] [facility].  Review of Systems Pain with urination and pressure and urine is cloudy, started last week  Reviewed past medical,surgical, social and family history. Reviewed medications and allergies.     Objective:   Physical Exam BP 137/80 (BP Location: Left Arm, Patient Position: Sitting, Cuff Size: Normal)   Pulse 89   Ht 5\' 2"  (1.575 m)   Wt 195 lb (88.5 kg)   BMI 35.67 kg/m  Urine +nitrates, trace blood and 1+ leuks   Skin warm and dry. Lungs: clear to ausculation bilaterally. Cardiovascular: regular rate and rhythm. No CVAT noted. Fall risk is moderate  Upstream - 09/02/22 1527       Pregnancy Intention Screening   Does the patient want to become pregnant in the next year? N/A    Does the patient's partner want to become pregnant in the next year? N/A    Would the patient like to discuss contraceptive options today? N/A      Contraception Wrap Up   Current Method Female Sterilization   hyst   End Method Female Sterilization   hyst   Contraception Counseling Provided No             Assessment:     1. Pain with urination +pain with urination UA C& S sent - POCT Urinalysis Dipstick - Urine Culture - Urinalysis, Routine w reflex microscopic  2. Abnormal urine odor Urine is cloudy  UA C&S sent  - POCT Urinalysis Dipstick - Urine Culture - Urinalysis, Routine w reflex microscopic  3. Acute cystitis with hematuria  Will rx septra ds and pyridium and push fluids  Meds ordered this encounter  Medications   sulfamethoxazole-trimethoprim (BACTRIM DS) 800-160 MG tablet    Sig: Take 1 tablet by mouth 2 (two) times daily. Take 1 bid    Dispense:  14 tablet     Refill:  0    Order Specific Question:   Supervising Provider    Answer:   Duane Lope H [[id number]]   phenazopyridine (PYRIDIUM) 200 MG tablet    Sig: Take 1 tablet (200 mg total) by mouth 3 (three) times daily as needed for pain.    Dispense:  10 tablet    Refill:  0    Order Specific Question:   Supervising Provider    Answer:   Duane Lope H [[id number]]   acyclovir (ZOVIRAX) 400 MG tablet    Sig: Take 1 twice daily for outbreak    Dispense:  60 tablet    Refill:  0    Order Specific Question:   Supervising Provider    Answer:   Despina Hidden, LUTHER H [[id number]]     4. Herpes simplex type II infection She request refill on acyclovir     Plan:     Follow up prn

## 2022-09-03 LAB — URINALYSIS, ROUTINE W REFLEX MICROSCOPIC
Bilirubin, UA: NEGATIVE
Glucose, UA: NEGATIVE
Ketones, UA: NEGATIVE
Nitrite, UA: POSITIVE — AB
RBC, UA: NEGATIVE
Specific Gravity, UA: 1.021 (ref 1.005–1.030)
Urobilinogen, Ur: 0.2 mg/dL (ref 0.2–1.0)
pH, UA: 6 (ref 5.0–7.5)

## 2022-09-03 LAB — MICROSCOPIC EXAMINATION
Casts: NONE SEEN /lpf
WBC, UA: >30 /hpf — AB (ref 0–5)

## 2022-09-04 LAB — URINE CULTURE

## 2022-10-08 ENCOUNTER — Other Ambulatory Visit: Payer: Self-pay | Admitting: Adult Health

## 2022-11-07 ENCOUNTER — Other Ambulatory Visit: Payer: Self-pay | Admitting: Adult Health

## 2023-03-19 ENCOUNTER — Other Ambulatory Visit: Payer: Self-pay

## 2023-03-19 DIAGNOSIS — Z87891 Personal history of nicotine dependence: Secondary | ICD-10-CM

## 2023-03-19 DIAGNOSIS — Z122 Encounter for screening for malignant neoplasm of respiratory organs: Secondary | ICD-10-CM

## 2023-05-05 IMAGING — CT CT CHEST LUNG CANCER SCREENING LOW DOSE W/O CM
2 of 3 series · 14 of 36 positions shown, 17 images · non-contrast
Comparison: Low-dose lung cancer screening chest CT 08/04/2019.

CLINICAL DATA: 59-year-old female former smoker (quit in 5948) with
61 pack-year history of smoking. Lung cancer screening examination.

EXAM:
CT CHEST WITHOUT CONTRAST LOW-DOSE FOR LUNG CANCER SCREENING
TECHNIQUE: Multidetector CT imaging of the chest was performed following the
standard protocol without IV contrast.

[Series 2: axial st · axial · 0.82mm/px · z∈[-66,+184]mm · 11 of 60 slices shown, 14 images]
[im 5/60  mediastinal]
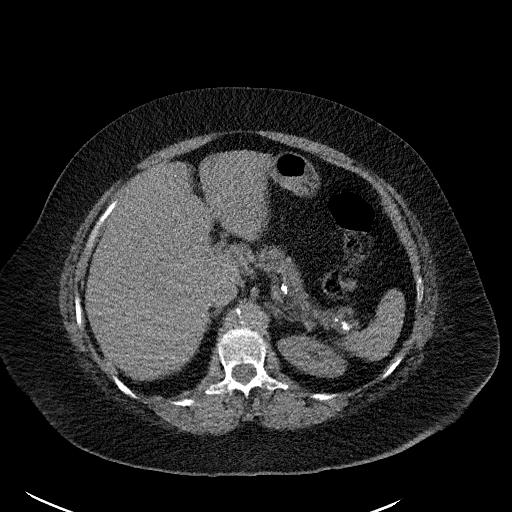
[im 5/60  lung]
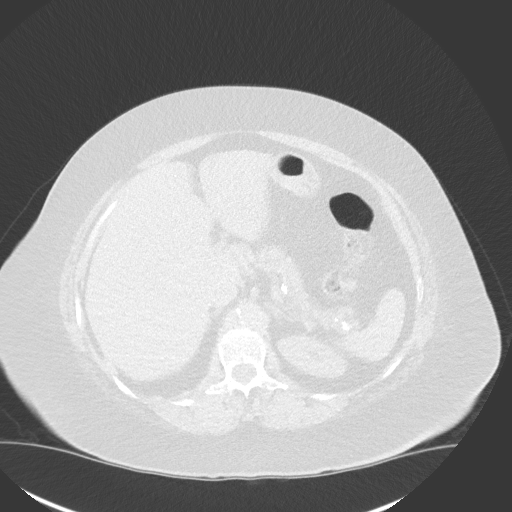
[im 9/60  lung]
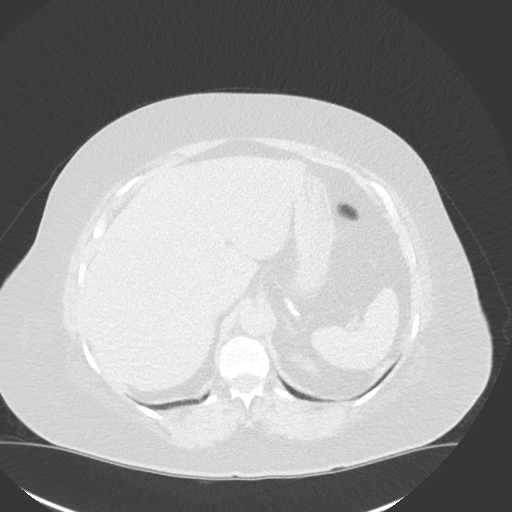
[im 14/60  lung]
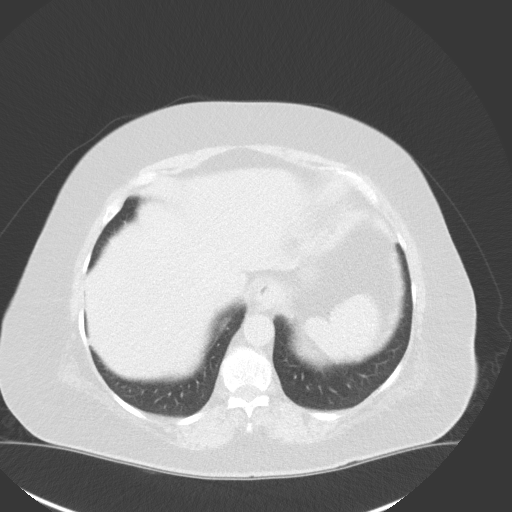
[im 20/60  lung]
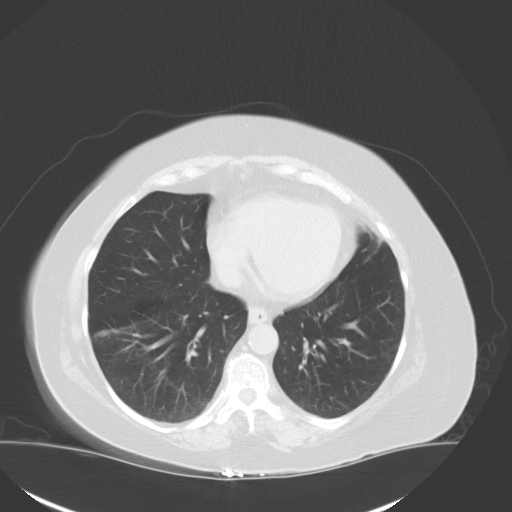
[im 25/60  mediastinal]
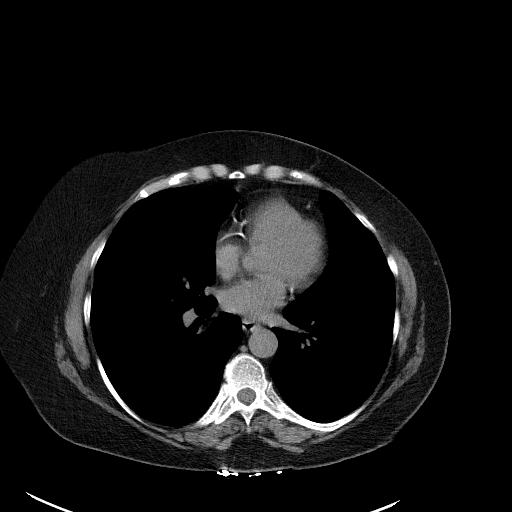
[im 25/60  lung]
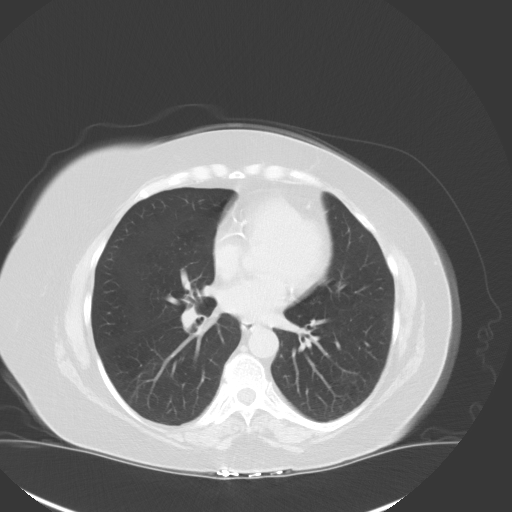
[im 31/60  lung]
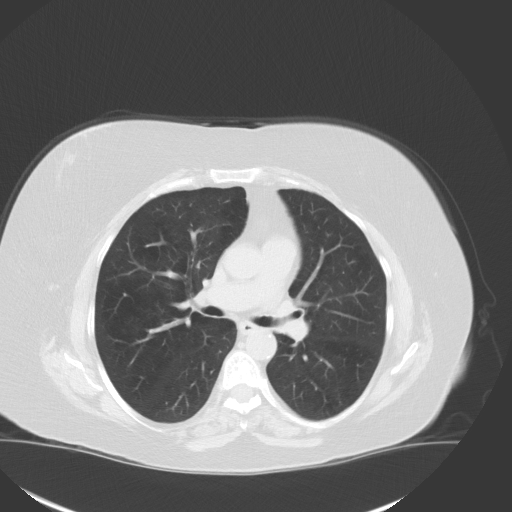
[im 35/60  lung]
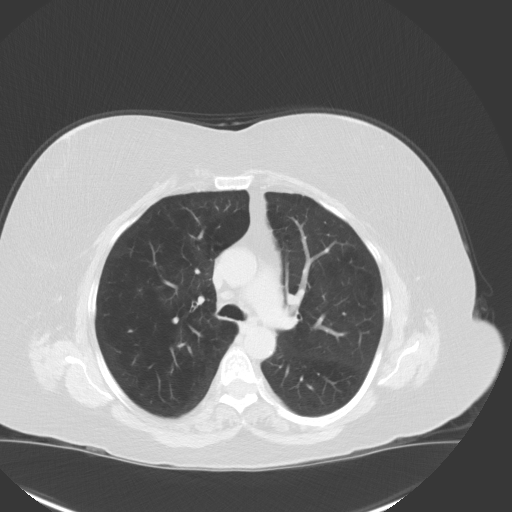
[im 40/60  lung]
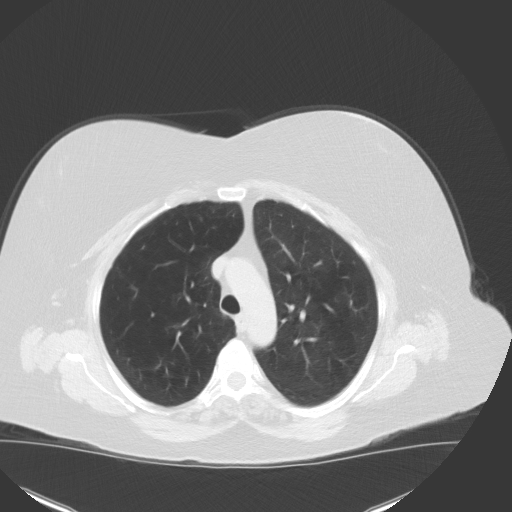
[im 46/60  mediastinal]
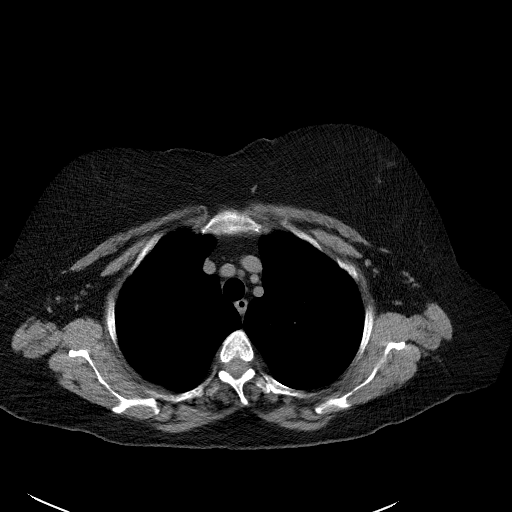
[im 46/60  lung]
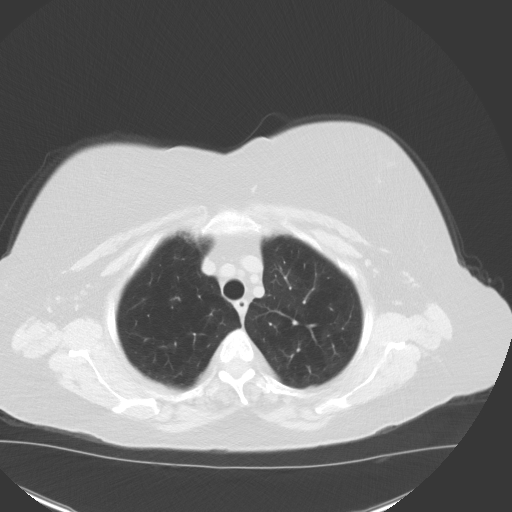
[im 51/60  lung]
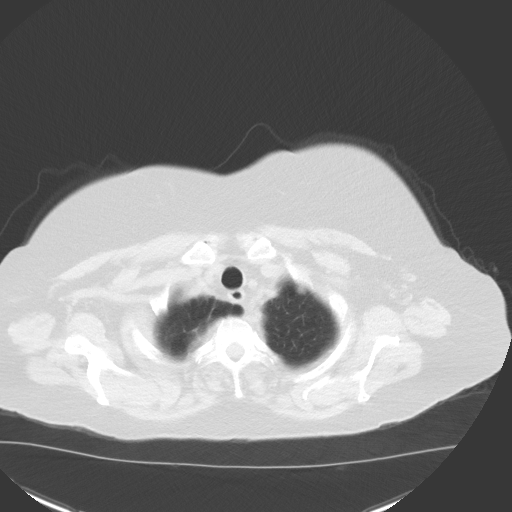
[im 55/60  lung]
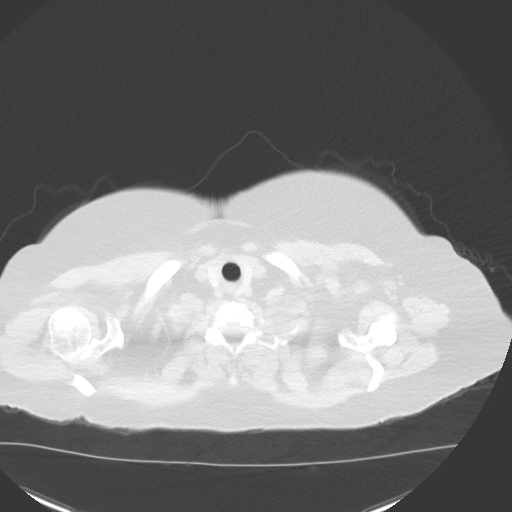

[Series 7: coronal · coronal · 0.66mm/px · 3 of 342 slices shown]
[im 69/342  lung]
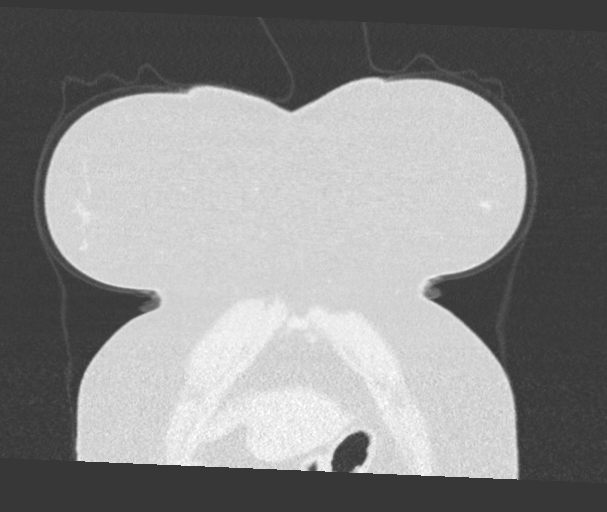
[im 137/342  lung]
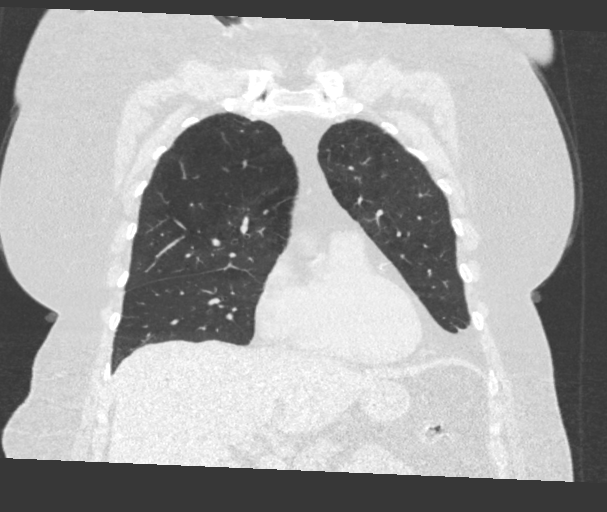
[im 205/342  lung]
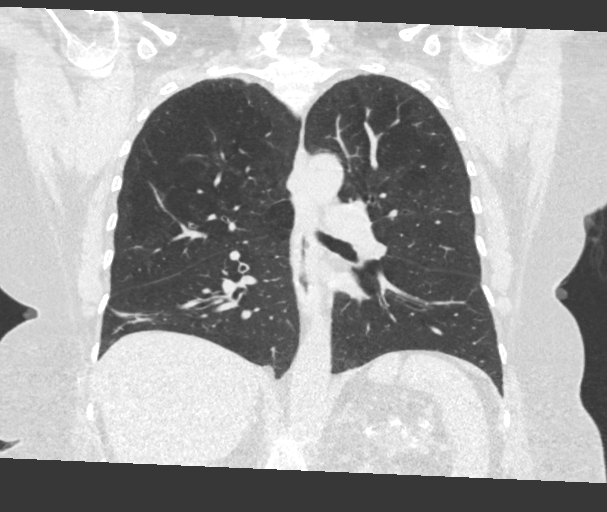

[14 of 36 positions shown; findings below may reference images not displayed]

FINDINGS: Cardiovascular: Heart size is normal. There is no significant
pericardial fluid, thickening or pericardial calcification. There is
aortic atherosclerosis, as well as atherosclerosis of the great
vessels of the mediastinum and the coronary arteries, including
calcified atherosclerotic plaque in the left main, left anterior
descending, left circumflex and right coronary arteries.

Mediastinum/Nodes: No pathologically enlarged mediastinal or hilar
lymph nodes. Please note that accurate exclusion of hilar adenopathy
is limited on noncontrast CT scans. Esophagus is unremarkable in
appearance. No axillary lymphadenopathy.

Lungs/Pleura: Multiple small pulmonary nodules are noted throughout
the lungs bilaterally, largest of which is in the posterior aspect
of the left lower lobe (axial image 184 of series 3), with a volume
derived mean diameter of 3.3 mm. No larger more suspicious appearing
pulmonary nodules or masses are noted. No acute consolidative
airspace disease. No pleural effusions. Mild diffuse bronchial wall
thickening with moderate centrilobular and paraseptal emphysema.

Upper Abdomen: Diffuse low attenuation throughout the visualized
hepatic parenchyma, indicative of hepatic steatosis.

Musculoskeletal: Chronic appearing compression fractures of T7 and
T12, most severe at T7 with 30% loss of anterior vertebral body
height.
IMPRESSION: 1. Lung-RADS 2S, benign appearance or behavior. Continue annual
screening with low-dose chest CT without contrast in 12 months.
2. The "S" modifier above refers to potentially clinically
significant non lung cancer related findings. Specifically, there is
aortic atherosclerosis, in addition to left main and 3 vessel
coronary artery disease. Please note that although the presence of
coronary artery calcium documents the presence of coronary artery
disease, the severity of this disease and any potential stenosis
cannot be assessed on this non-gated CT examination. Assessment for
potential risk factor modification, dietary therapy or pharmacologic
therapy may be warranted, if clinically indicated.
3. Mild diffuse bronchial wall thickening with moderate
centrilobular and paraseptal emphysema; imaging findings suggestive
of underlying COPD.
4. Mild hepatic steatosis.

Aortic Atherosclerosis (MWPA7-ADY.Y) and Emphysema (MWPA7-CF3.V).

## 2023-06-24 ENCOUNTER — Ambulatory Visit (HOSPITAL_COMMUNITY): Payer: Medicaid Other

## 2023-07-08 ENCOUNTER — Other Ambulatory Visit: Payer: Self-pay | Admitting: Obstetrics & Gynecology

## 2023-07-08 MED ORDER — ESTRADIOL 1 MG PO TABS: 1.0000 mg | ORAL_TABLET | Freq: Every day | ORAL | 11 refills | Status: AC

## 2023-07-08 NOTE — Addendum Note (Signed)
 Addended by: Wendelyn Halter on: 07/08/2023 03:41 PM   Modules accepted: Orders

## 2023-08-08 ENCOUNTER — Encounter: Payer: Self-pay | Admitting: *Deleted

## 2023-08-11 ENCOUNTER — Encounter: Payer: Self-pay | Admitting: *Deleted

## 2023-08-11 NOTE — Progress Notes (Signed)
Patient notified via mail of LDCT l ung cancer screening results with recommendations to follow up in 12 months.  Also notified of incidental findings and need to follow up with PCP.  Patient's referring provider was sent a copy of results.    IMPRESSION: Lung-RADS 2, benign appearance or behavior. Continue annual screening with low-dose chest CT without contrast in 12 months.  Aortic Atherosclerosis (ICD10-I70.0) and Emphysema (ICD10-J43.9).   

## 2023-08-25 ENCOUNTER — Other Ambulatory Visit (HOSPITAL_COMMUNITY): Payer: Self-pay | Admitting: Physician Assistant

## 2023-08-25 DIAGNOSIS — Z122 Encounter for screening for malignant neoplasm of respiratory organs: Secondary | ICD-10-CM

## 2023-09-13 ENCOUNTER — Ambulatory Visit (HOSPITAL_COMMUNITY)
Admission: RE | Admit: 2023-09-13 | Discharge: 2023-09-13 | Disposition: A | Discharge status: Home / Self Care | Source: Ambulatory Visit | Attending: Physician Assistant | Admitting: Physician Assistant

## 2023-09-13 DIAGNOSIS — Z122 Encounter for screening for malignant neoplasm of respiratory organs: Secondary | ICD-10-CM | POA: Insufficient documentation

## 2023-11-10 ENCOUNTER — Other Ambulatory Visit: Payer: Self-pay

## 2023-11-10 DIAGNOSIS — N200 Calculus of kidney: Secondary | ICD-10-CM

## 2023-11-12 ENCOUNTER — Ambulatory Visit: Admitting: Urology

## 2024-01-14 ENCOUNTER — Ambulatory Visit: Admitting: Urology

## 2024-01-14 DIAGNOSIS — N200 Calculus of kidney: Secondary | ICD-10-CM

## 2024-02-26 ENCOUNTER — Other Ambulatory Visit: Payer: Self-pay

## 2024-02-26 DIAGNOSIS — Z87891 Personal history of nicotine dependence: Secondary | ICD-10-CM

## 2024-02-26 DIAGNOSIS — Z122 Encounter for screening for malignant neoplasm of respiratory organs: Secondary | ICD-10-CM
